# Patient Record
Sex: Female | Born: 1990 | Race: White | Hispanic: No | Marital: Married | Smoking: Never smoker
Health system: Southern US, Community
[De-identification: ages and names within clinical notes are randomized; demographics above are authoritative.]

## PROBLEM LIST (undated history)

## (undated) DIAGNOSIS — D649 Anemia, unspecified: Secondary | ICD-10-CM

## (undated) DIAGNOSIS — T8859XA Other complications of anesthesia, initial encounter: Secondary | ICD-10-CM

## (undated) HISTORY — DX: Anemia, unspecified: D64.9

## (undated) HISTORY — DX: Other complications of anesthesia, initial encounter: T88.59XA

---

## 2021-08-08 ENCOUNTER — Ambulatory Visit (INDEPENDENT_AMBULATORY_CARE_PROVIDER_SITE_OTHER): Payer: 59

## 2021-08-08 ENCOUNTER — Other Ambulatory Visit: Payer: Self-pay

## 2021-08-08 VITALS — BP 121/68 | HR 77 | Ht 70.87 in | Wt 137.0 lb

## 2021-08-08 DIAGNOSIS — Z3201 Encounter for pregnancy test, result positive: Secondary | ICD-10-CM | POA: Diagnosis not present

## 2021-08-08 LAB — POCT PREGNANCY, URINE: Preg Test, Ur: POSITIVE — AB

## 2021-08-08 MED ORDER — PRENATAL 27-1 MG PO TABS: 1.0000 | ORAL_TABLET | Freq: Every day | ORAL | 8 refills | Status: DC

## 2021-08-08 NOTE — Progress Notes (Signed)
Pt dropped off urine today for UPT. UPT is positive. Pt is new to [place] from [place]. [facility] interpreter used for visit.    Pt denies any vaginal bleeding, abd pain or cramps at this time. Pt advised to start taking PNV. Pt does not have PNV. PNV sent to pharmacy on file. Pt verbalized understanding and agreeable to plan of care. Pt states having some nausea throughout the days, but denies any vomiting. Pt given safe medication list.   LMP: 06/10/2021 EDC: 03/17/2022 [redacted]w[redacted]d   Judeth Cornfield, RN

## 2021-08-08 NOTE — Patient Instructions (Signed)

## 2021-08-15 ENCOUNTER — Other Ambulatory Visit (HOSPITAL_COMMUNITY)
Admission: RE | Admit: 2021-08-15 | Discharge: 2021-08-15 | Disposition: A | Discharge status: Home / Self Care | Payer: Managed Care, Other (non HMO) | Source: Ambulatory Visit | Attending: Family Medicine | Admitting: Family Medicine

## 2021-08-15 ENCOUNTER — Other Ambulatory Visit: Payer: Self-pay

## 2021-08-15 ENCOUNTER — Ambulatory Visit (INDEPENDENT_AMBULATORY_CARE_PROVIDER_SITE_OTHER): Payer: 59 | Admitting: *Deleted

## 2021-08-15 VITALS — BP 102/68 | HR 83 | Wt 136.0 lb

## 2021-08-15 DIAGNOSIS — O09899 Supervision of other high risk pregnancies, unspecified trimester: Secondary | ICD-10-CM | POA: Diagnosis present

## 2021-08-15 DIAGNOSIS — O0990 Supervision of high risk pregnancy, unspecified, unspecified trimester: Secondary | ICD-10-CM | POA: Insufficient documentation

## 2021-08-15 DIAGNOSIS — Z8751 Personal history of pre-term labor: Secondary | ICD-10-CM

## 2021-08-15 DIAGNOSIS — Z789 Other specified health status: Secondary | ICD-10-CM | POA: Insufficient documentation

## 2021-08-15 HISTORY — DX: Personal history of pre-term labor: Z87.51

## 2021-08-15 NOTE — Patient Instructions (Signed)
?  At our [facility], we work as an integrated team, providing care to address both physical and emotional health. Your medical provider may refer you to see our Behavioral Health Clinician (BHC) on the same day you see your medical provider, as availability permits; often scheduled virtually at your convenience.  ?Our BHC is available to all patients, visits generally last between 20-30 minutes, but can be longer or shorter, depending on patient need. The BHC offers help with stress management, coping with symptoms of depression and anxiety, major life changes , sleep issues, changing risky behavior, grief and loss, life stress, working on personal life goals, and  behavioral health issues, as these all affect your overall health and wellness.  ?The BHC is NOT available for the following: FMLA paperwork, court-ordered evaluations, specialty assessments (custody or disability), letters to employers, or obtaining certification for an emotional support animal. The BHC does not provide long-term therapy. ?You have the right to refuse integrated behavioral health services, or to reschedule to see the BHC at a later date.  ?Confidentiality exception: If it is suspected that a child or disabled adult is being abused or neglected, we are required by law to report that to either Child Protective Services or Adult Protective Services.  ?If you have a diagnosis of Bipolar affective disorder, Schizophrenia, or recurrent Major depressive disorder, we will recommend that you establish care with a psychiatrist, as these are lifelong, chronic conditions, and we want your overall emotional health and medications to be more closely monitored. If you anticipate needing extended maternity leave due to mental health issues postpartum, it it recommended you inform your medical provider, so we can put in a referral to a psychiatrist as soon as possible. The BHC is unable to recommend an extended maternity leave for mental  health issues. ?Your medical provider or BHC may refer you to a therapist for ongoing, traditional therapy, or to a psychiatrist, for medication management, if it would benefit your overall health. ?Depending on your insurance, you may have a copay or be charged a deductible, depending on your insurance, to see the BHC. If you are uninsured, it is recommended that you apply for financial assistance. (Forms may be requested at the front desk for in-person visits, via MyChart, or request a form during a virtual visit).  ?If you see the BHC more than 6 times, you will have to complete a comprehensive clinical assessment interview with the BHC to resume integrated services.  ?For virtual visits with the BHC, you must be physically in the state of [place] at the time of the visit. For example, if you live in [place], you will have to do an in-person visit with the BHC, and your out-of-state insurance may not cover behavioral health services in [place]. If you are going out of the state or country for any reason, the BHC may see you virtually when you return to [place], but not while you are physically outside of [place].  ?  ?Here is a link to the Pregnancy Navigators . Please ?Fill out prior to your New OB appointment.  ? ?English Link: https://[email or web address].tfaforms.[email or web address]?[email or web address] ? ?Spanish Link: https://[place].tfaforms.[email or web address]?[email or web address]  ?[email or web address] is a website with information to help you prepare for Labor and delivery, patient information, visitor information and more.   ?

## 2021-08-15 NOTE — Progress Notes (Signed)
New OB Intake ? ?Patient came to office for intake.  ?I explained I am completing New OB Intake today. We discussed her EDD of 03/17/22 that is based on LMP of 06/10/22. Pt is G2/P0101. I reviewed her allergies, medications, Medical/Surgical/OB history, and appropriate screenings. I informed her of [facility] services. Based on history, this is a/an  pregnancy complicated by PTD  .  ? ?Patient Active Problem List  ? Diagnosis Date Noted  ? Supervision of high risk pregnancy, antepartum 08/15/2021  ? History of preterm delivery, currently pregnant 08/15/2021  ? Language barrier 08/15/2021  ? ? ?Concerns addressed today ? ?Delivery Plans:  ?Plans to deliver at [facility].  ? ?Waterbirth candidate? Not offered ? ?MyChart/Babyscripts ?Does not have MyChart - sent text to her to sign up. She received text. . I explained pt will have some visits in office and some virtually. Not candidate for Babyscripts . ? ?Blood Pressure Cuff  ?Patient has private insurance; instructed to purchase blood pressure cuff and bring to first prenatal appt. Explained after first prenatal appt pt will check weekly and document on log.  ? ?Weight scale: Patient ?does not  have weight scale- has [employer].  ? ?Anatomy US ?Explained first scheduled [place] will be around 19 weeks. Anatomy US scheduled for 10/21/21 at 0900. Pt notified to arrive at 0845. ? ?Labs ?Discussed Avelina Laine genetic screening with patient. Would like both Panorama and Horizon drawn at new OB visit.Also if interested in genetic testing, tell patient she will need AFP 15-21 weeks to complete genetic testing .Routine prenatal labs drawn today. Genetic not drawn due to too early.  ? ?Covid Vaccine ?Patient has not had covid vaccine.  ? ?Is patient a CenteringPregnancy candidate? Not a candidate  ? ?Is patient a Mom+Baby Combined Care candidate? Not a candidate    ? ?Informed patient of [facility] website  and placed link in her AVS.  ? ?Social Determinants of Health ?Food  Insecurity: Patient denies food insecurity. ?WIC Referral: Patient is not interested in referral to [facility].  ?Transportation: Patient denies transportation needs. ?Childcare: Discussed no children allowed at ultrasound appointments. Offered childcare services; patient declines childcare services at this time. ? ?Send link to Pregnancy Navigators ? ? ?Placed OB Box on problem list and updated ? ?First visit review ?I reviewed new OB appt with pt. I explained she will have a pelvic exam, genetic screening if desired, and PAP smear. New ob appointment was scheduled with app, changed to MD Explained pt will be seen by Dr. Jolayne Panther at first visit; encounter routed to appropriate provider. Explained that patient will be seen by pregnancy navigator following visit with provider. [facility] information placed in AVS.  ? Nancy Fetter ?08/15/2021  12:57 PM  ?

## 2021-08-16 ENCOUNTER — Telehealth: Payer: Self-pay

## 2021-08-16 LAB — HEMOGLOBIN A1C
Est. average glucose Bld gHb Est-mCnc: 100 mg/dL
Hgb A1c MFr Bld: 5.1 % (ref 4.8–5.6)

## 2021-08-16 LAB — CBC/D/PLT+RPR+RH+ABO+RUBIGG...
Antibody Screen: NEGATIVE
Basophils Absolute: 0 10*3/uL (ref 0.0–0.2)
Basos: 0 %
EOS (ABSOLUTE): 0.1 10*3/uL (ref 0.0–0.4)
Eos: 1 %
HCV Ab: NONREACTIVE
HIV Screen 4th Generation wRfx: NONREACTIVE
Hematocrit: 36.2 % (ref 34.0–46.6)
Hemoglobin: 12.5 g/dL (ref 11.1–15.9)
Hepatitis B Surface Ag: NEGATIVE
Immature Grans (Abs): 0 10*3/uL (ref 0.0–0.1)
Immature Granulocytes: 0 %
Lymphocytes Absolute: 1.9 10*3/uL (ref 0.7–3.1)
Lymphs: 22 %
MCH: 31.3 pg (ref 26.6–33.0)
MCHC: 34.5 g/dL (ref 31.5–35.7)
MCV: 91 fL (ref 79–97)
Monocytes Absolute: 0.4 10*3/uL (ref 0.1–0.9)
Monocytes: 4 %
Neutrophils Absolute: 6.4 10*3/uL (ref 1.4–7.0)
Neutrophils: 73 %
Platelets: 317 10*3/uL (ref 150–450)
RBC: 4 x10E6/uL (ref 3.77–5.28)
RDW: 11.6 % — ABNORMAL LOW (ref 11.7–15.4)
RPR Ser Ql: NONREACTIVE
Rh Factor: POSITIVE
Rubella Antibodies, IGG: 7.91 index (ref >0.99)
WBC: 8.8 10*3/uL (ref 3.4–10.8)

## 2021-08-16 LAB — GC/CHLAMYDIA PROBE AMP (~~LOC~~) NOT AT ARMC
Chlamydia: NEGATIVE
Comment: NEGATIVE
Comment: NORMAL
Neisseria Gonorrhea: NEGATIVE

## 2021-08-16 LAB — HCV INTERPRETATION

## 2021-08-16 NOTE — Telephone Encounter (Signed)
Call transferred from front office stating patient was using husband as interpreter with complaint of brown discharge. I spoke with patient and she states she is experiencing some brown vaginal discharge that began yesterday. Patient denies any pain, vaginal irritation or odor to discharge. Patient denies having sexual intercourse preceding this discharge. I explained to patient that if this discharge does not resolve by the middle of next week to notify us and then we can set up a Nurse Visit appointment for a self swab. Patient verbalized understanding and denies any other questions.  ? ?Alesia Richards, RN ?08/16/21  ?

## 2021-08-16 NOTE — Progress Notes (Signed)
Patient was assessed and managed by nursing staff during this encounter. I have reviewed the chart and agree with the documentation and plan. I have also made any necessary editorial changes. ? ?Mora Bellman, MD ?08/16/2021 1:50 PM  ? ?

## 2021-08-17 LAB — URINE CULTURE, OB REFLEX

## 2021-08-17 LAB — CULTURE, OB URINE

## 2021-08-28 ENCOUNTER — Encounter: Payer: 59 | Admitting: Family Medicine

## 2021-09-05 ENCOUNTER — Ambulatory Visit (INDEPENDENT_AMBULATORY_CARE_PROVIDER_SITE_OTHER): Payer: 59 | Admitting: Obstetrics and Gynecology

## 2021-09-05 ENCOUNTER — Encounter: Payer: Self-pay | Admitting: Obstetrics and Gynecology

## 2021-09-05 ENCOUNTER — Other Ambulatory Visit (HOSPITAL_COMMUNITY)
Admission: RE | Admit: 2021-09-05 | Discharge: 2021-09-05 | Disposition: A | Discharge status: Home / Self Care | Payer: Managed Care, Other (non HMO) | Source: Ambulatory Visit | Attending: Obstetrics and Gynecology | Admitting: Obstetrics and Gynecology

## 2021-09-05 ENCOUNTER — Other Ambulatory Visit: Payer: Self-pay

## 2021-09-05 VITALS — BP 128/81 | HR 90 | Wt 136.9 lb

## 2021-09-05 DIAGNOSIS — O09899 Supervision of other high risk pregnancies, unspecified trimester: Secondary | ICD-10-CM

## 2021-09-05 DIAGNOSIS — O0990 Supervision of high risk pregnancy, unspecified, unspecified trimester: Secondary | ICD-10-CM

## 2021-09-05 DIAGNOSIS — Z1151 Encounter for screening for human papillomavirus (HPV): Secondary | ICD-10-CM

## 2021-09-05 DIAGNOSIS — O34219 Maternal care for unspecified type scar from previous cesarean delivery: Secondary | ICD-10-CM | POA: Insufficient documentation

## 2021-09-05 DIAGNOSIS — Z789 Other specified health status: Secondary | ICD-10-CM

## 2021-09-05 NOTE — Patient Instructions (Signed)
First Trimester of Pregnancy ?The first trimester of pregnancy starts on the first day of your last menstrual period until the end of week 12. This is months 1 through 3 of pregnancy. A week after a sperm fertilizes an egg, the egg will implant into the wall of the uterus and begin to develop into a baby. By the end of 12 weeks, all the baby's organs will be formed and the baby will be 2-3 inches in size. ?Body changes during your first trimester ?Your body goes through many changes during pregnancy. The changes vary and generally return to normal after your baby is born. ?Physical changes ?You may gain or lose weight. ?Your breasts may begin to grow larger and become tender. The tissue that surrounds your nipples (areola) may become darker. ?Dark spots or blotches (chloasma or mask of pregnancy) may develop on your face. ?You may have changes in your hair. These can include thickening or thinning of your hair or changes in texture. ?Health changes ?You may feel nauseous, and you may vomit. ?You may have heartburn. ?You may develop headaches. ?You may develop constipation. ?Your gums may bleed and may be sensitive to brushing and flossing. ?Other changes ?You may tire easily. ?You may urinate more often. ?Your menstrual periods will stop. ?You may have a loss of appetite. ?You may develop cravings for certain kinds of food. ?You may have changes in your emotions from day to day. ?You may have more vivid and strange dreams. ?Follow these instructions at home: ?Medicines ?Follow your health care provider's instructions regarding medicine use. Specific medicines may be either safe or unsafe to take during pregnancy. Do not take any medicines unless told to by your health care provider. ?Take a prenatal vitamin that contains at least 600 micrograms (mcg) of folic acid. ?Eating and drinking ?Eat a healthy diet that includes fresh fruits and vegetables, whole grains, good sources of protein such as meat, eggs, or tofu,  and low-fat dairy products. ?Avoid raw meat and unpasteurized juice, milk, and cheese. These carry germs that can harm you and your baby. ?If you feel nauseous or you vomit: ?Eat 4 or 5 small meals a day instead of 3 large meals. ?Try eating a few soda crackers. ?Drink liquids between meals instead of during meals. ?You may need to take these actions to prevent or treat constipation: ?Drink enough fluid to keep your urine pale yellow. ?Eat foods that are high in fiber, such as beans, whole grains, and fresh fruits and vegetables. ?Limit foods that are high in fat and processed sugars, such as fried or sweet foods. ?Activity ?Exercise only as directed by your health care provider. Most people can continue their usual exercise routine during pregnancy. Try to exercise for 30 minutes at least 5 days a week. ?Stop exercising if you develop pain or cramping in the lower abdomen or lower back. ?Avoid exercising if it is very hot or humid or if you are at high altitude. ?Avoid heavy lifting. ?If you choose to, you may have sex unless your health care provider tells you not to. ?Relieving pain and discomfort ?Wear a good support bra to relieve breast tenderness. ?Rest with your legs elevated if you have leg cramps or low back pain. ?If you develop bulging veins (varicose veins) in your legs: ?Wear support hose as told by your health care provider. ?Elevate your feet for 15 minutes, 3-4 times a day. ?Limit salt in your diet. ?Safety ?Wear your seat belt at all times when driving  or riding in a car. ?Talk with your health care provider if someone is verbally or physically abusive to you. ?Talk with your health care provider if you are feeling sad or have thoughts of hurting yourself. ?Lifestyle ?Do not use hot tubs, steam rooms, or saunas. ?Do not douche. Do not use tampons or scented sanitary pads. ?Do not use herbal remedies, alcohol, illegal drugs, or medicines that are not approved by your health care provider. Chemicals  in these products can harm your baby. ?Do not use any products that contain nicotine or tobacco, such as cigarettes, e-cigarettes, and chewing tobacco. If you need help quitting, ask your health care provider. ?Avoid cat litter boxes and soil used by cats. These carry germs that can cause birth defects in the baby and possibly loss of the unborn baby (fetus) by miscarriage or stillbirth. ?General instructions ?During routine prenatal visits in the first trimester, your health care provider will do a physical exam, perform necessary tests, and ask you how things are going. Keep all follow-up visits. This is important. ?Ask for help if you have counseling or nutritional needs during pregnancy. Your health care provider can offer advice or refer you to specialists for help with various needs. ?Schedule a dentist appointment. At home, brush your teeth with a soft toothbrush. Floss gently. ?Write down your questions. Take them to your prenatal visits. ?Where to find more information ?American Pregnancy Association: americanpregnancy.org ?[employer] of [employer]st[employer]ians [employer] Gynecologists: https://www.todd-brady.net/ ?Office on Women's Health: MightyReward.co.nz ?Contact a health care provider if you have: ?Dizziness. ?A fever. ?Mild pelvic cramps, pelvic pressure, or nagging pain in the abdominal area. ?Nausea, vomiting, or diarrhea that lasts for 24 hours or longer. ?A bad-smelling vaginal discharge. ?Pain when you urinate. ?Known exposure to a contagious illness, such as chickenpox, measles, Zika virus, HIV, or hepatitis. ?Get help right away if you have: ?Spotting or bleeding from your vagina. ?Severe abdominal cramping or pain. ?Shortness of breath or chest pain. ?Any kind of trauma, such as from a fall or a car crash. ?New or increased pain, swelling, or redness in an arm or leg. ?Summary ?The first trimester of pregnancy starts on the first day of your last menstrual period until the end of week  12 (months 1 through 3). ?Eating 4 or 5 small meals a day rather than 3 large meals may help to relieve nausea and vomiting. ?Do not use any products that contain nicotine or tobacco, such as cigarettes, e-cigarettes, and chewing tobacco. If you need help quitting, ask your health care provider. ?Keep all follow-up visits. This is important. ?This information is not intended to replace advice given to you by your health care provider. Make sure you discuss any questions you have with your health care provider. ?Document Revised: 11/09/2019 Document Reviewed: 09/15/2019 ?Elsevier Patient Education ? 2022 Elsevier Inc. ? ?Vaginal Birth After Cesarean Delivery ?Vaginal birth after cesarean delivery (VBAC) means giving birth vaginally after previously delivering a baby through a cesarean section, or C-section. VBAC may be a safe option for you, depending on your health and other factors. ?Discuss VBAC with your health care provider early in your pregnancy, so you can understand the risks, benefits, and options. Having these discussions early will give you time to make your birth plan. ?What increases the chances for a successful VBAC? ?These factors increase your chances of a successful VBAC: ?You have had only one previous C-section. ?You had a low transverse incision for your C-section. ?You have had a successful vaginal birth. ?  Your labor starts naturally on or before your due date. ?You and your baby have had a healthy pregnancy. ?Your baby is head-down. ?What happens when I arrive at the birth center or hospital? ?Once you are in labor and have been admitted into the hospital or birth center, your health care team may: ?Review your pregnancy history and go over any concerns you have. ?Talk with you about your birth plan and discuss pain control options. ?Check your blood pressure, breathing rate, and heart rate. ?Check your baby's heartbeat. ?Monitor your uterus for contractions. ?Check whether your bag of water  (amniotic sac) has broken (ruptured). ?Insert an IV into one of your veins. You may get fluids and medicine through the IV. ?Monitoring and exams ?Your health care team may check your contractions (uterin

## 2021-09-05 NOTE — Progress Notes (Signed)
?  Subjective:  ? ? Heather Leonard is a G2P0101 [redacted]w[redacted]d being seen today for her first obstetrical visit.  Her obstetrical history is significant for PPROM resulting in an emergency cesarean section. Patient is a poor historian regarding circumstances of her cesarean section. At time she describes failure to progress after 30 hours, PPROM in the setting of IUGR and emergency dur to vaginal bleeding. Patient is uncertain of the type of incision and reports multiple complications following we cesarean section but is not able to describe them. Patient will attempt to obtain records. Patient does intend to breast feed. Pregnancy history fully reviewed. ? ?Patient reports no complaints. ? ?Vitals:  ? 09/05/21 1516  ?BP: 128/81  ?Pulse: 90  ?Weight: 136 lb 14.4 oz (62.1 kg)  ? ? ?HISTORY: ?OB History  ?Gravida Para Term Preterm AB Living  ?2 1   1   1   ?SAB IAB Ectopic Multiple Live Births  ?        1  ?  ?# Outcome Date GA Lbr Len/2nd Weight Sex Delivery Anes PTL Lv  ?2 Current           ?1 Preterm 10/23/18 [redacted]w[redacted]d  3 lb 8.4 oz (1.6 kg) M CS-Unspec EPI Y LIV  ?   Birth Comments: said that day woke up PPROM, bleeding and had emergency c/s  ? ?Past Medical History:  ?Diagnosis Date  ? Anemia   ? Complication of anesthesia   ? problems walking and moving 1 week later  ? ?Past Surgical History:  ?Procedure Laterality Date  ? CESAREAN SECTION    ? ?Family History  ?Problem Relation Age of Onset  ? Varicose Veins Mother   ? ? ? ?Exam  ? ? ?Uterus:   12-weeks  ?Pelvic Exam:   ? Perineum: Normal Perineum  ? Vulva: normal  ? Vagina:  normal mucosa, normal discharge  ? pH:   ? Cervix: nulliparous appearance and cervix is closed and long  ? Adnexa: no mass, fullness, tenderness  ? Bony Pelvis: gynecoid  ?System: Breast:  normal appearance, no masses or tenderness  ? Skin: normal coloration and turgor, no rashes ?  ? Neurologic: oriented, no focal deficits  ? Extremities: normal strength, tone, and muscle mass  ? HEENT extra ocular  movement intact  ? Mouth/Teeth mucous membranes moist, pharynx normal without lesions and dental hygiene good  ? Neck supple and no masses  ? Cardiovascular: regular rate and rhythm  ? Respiratory:  appears well, vitals normal, no respiratory distress, acyanotic, normal RR, chest clear, no wheezing, crepitations, rhonchi, normal symmetric air entry  ? Abdomen: soft, non-tender; bowel sounds normal; no masses,  no organomegaly  ? Urinary:   ? ? ?  ?Assessment:  ? ? Pregnancy: G2P0101 ?Patient Active Problem List  ? Diagnosis Date Noted  ? Supervision of high risk pregnancy, antepartum 08/15/2021  ? History of preterm delivery, currently pregnant 08/15/2021  ? Language barrier 08/15/2021  ? ?  ? ?  ?Plan:  ? ?  ?Initial labs drawn. ?Prenatal vitamins. ?Problem list reviewed and updated. ?Genetic Screening discussed : panorama collected. ? Ultrasound discussed; fetal survey: scheduled on 10/21/21 ?Briefly discussed TOLAC vs RCS- Patient will attempt to obtain delivery records as she is interested in TOLAC ? Follow up in 4 weeks. ?50% of 30 min visit spent on counseling and coordination of care.  ?  ? ?Zygmund Passero ?09/05/2021 ? ? ?

## 2021-09-11 ENCOUNTER — Telehealth: Payer: Self-pay | Admitting: Family Medicine

## 2021-09-11 NOTE — Telephone Encounter (Signed)
Patient has been having bad headaches and requesting someone to call her.  ?

## 2021-09-12 LAB — CYTOLOGY - PAP
Chlamydia: NEGATIVE
Comment: NEGATIVE
Comment: NEGATIVE
Comment: NEGATIVE
Comment: NORMAL
Diagnosis: NEGATIVE
High risk HPV: NEGATIVE
Neisseria Gonorrhea: NEGATIVE
Trichomonas: NEGATIVE

## 2021-09-17 NOTE — Telephone Encounter (Signed)
Called pt with [facility] interpreter ID [phone]. Spoke with patient's husband. Pt does not have phone number. Husband states that patient is feeling much better. Still having some cough. Does have list of medications safe to take in pregnancy. Husband gives patient phone number, states she will need interpreter. Husband will notify patient that I called. Called pt's number with [employer] interpreter ID [phone]; message states that this number cannot be reached at this time. ?

## 2021-10-04 ENCOUNTER — Ambulatory Visit (INDEPENDENT_AMBULATORY_CARE_PROVIDER_SITE_OTHER): Payer: 59 | Admitting: Certified Nurse Midwife

## 2021-10-04 VITALS — BP 109/72 | HR 89 | Wt 137.9 lb

## 2021-10-04 DIAGNOSIS — Z98891 History of uterine scar from previous surgery: Secondary | ICD-10-CM

## 2021-10-04 DIAGNOSIS — O219 Vomiting of pregnancy, unspecified: Secondary | ICD-10-CM

## 2021-10-04 DIAGNOSIS — R55 Syncope and collapse: Secondary | ICD-10-CM

## 2021-10-04 DIAGNOSIS — O26892 Other specified pregnancy related conditions, second trimester: Secondary | ICD-10-CM

## 2021-10-04 DIAGNOSIS — R519 Headache, unspecified: Secondary | ICD-10-CM

## 2021-10-04 DIAGNOSIS — Z3492 Encounter for supervision of normal pregnancy, unspecified, second trimester: Secondary | ICD-10-CM

## 2021-10-04 DIAGNOSIS — Z3A16 16 weeks gestation of pregnancy: Secondary | ICD-10-CM

## 2021-10-04 NOTE — Progress Notes (Signed)
? ?  PRENATAL VISIT NOTE ? ?Subjective:  ?Heather Leonard is a 31 y.o. G2P0101 at [redacted]w[redacted]d being seen today for ongoing prenatal care.  She is currently monitored for the following issues for this high-risk pregnancy and has Supervision of high risk pregnancy, antepartum; History of preterm delivery, currently pregnant; Language barrier; and Previous cesarean delivery, antepartum on their problem list. ? ?Patient reports headache, nausea, and had an episode last week of near syncope while walking around the playground which resolved with deep breathing, rest and hydration. This has happened before during growth spurts as an adolescent  .  Contractions: Not present. Vag. Bleeding: None.  Movement: Absent. Denies leaking of fluid.  ? ?The following portions of the patient's history were reviewed and updated as appropriate: allergies, current medications, past family history, past medical history, past social history, past surgical history and problem list.  ? ?Objective:  ? ?Vitals:  ? 10/04/21 1137  ?BP: 109/72  ?Pulse: 89  ?Weight: 137 lb 14.4 oz (62.6 kg)  ? ? ?Fetal Status: Fetal Heart Rate (bpm): 158   Movement: Absent    ? ?General:  Alert, oriented and cooperative. Patient is in no acute distress.  ?Skin: Skin is warm and dry. No rash noted.   ?Cardiovascular: Normal heart rate noted  ?Respiratory: Normal respiratory effort, no problems with respiration noted  ?Abdomen: Soft, gravid, appropriate for gestational age.  Pain/Pressure: Present     ?Pelvic: Cervical exam deferred        ?Extremities: Normal range of motion.  Edema: None  ?Mental Status: Normal mood and affect. Normal behavior. Normal judgment and thought content.  ? ?Assessment and Plan:  ?Pregnancy: G2P0101 at [redacted]w[redacted]d ?1. Supervision of low-risk pregnancy, second trimester ?- Doing well overall ? ?2. [redacted] weeks gestation of pregnancy ?- Routine OB care  ? ?3. History of cesarean section ?- Desires TOLAC, will need MD consent ? ?4. Nausea and vomiting  during pregnancy ?- Getting much better ? ?5. Pregnancy headache in second trimester ?- Resolves with hydration, informed she can take Tylenol for headaches ? ?6. Near syncope ?- reviewed importance of regular food and hydration to prevent these episodes. Will refer to OB cardio if needed ? ?Preterm labor symptoms and general obstetric precautions including but not limited to vaginal bleeding, contractions, leaking of fluid and fetal movement were reviewed in detail with the patient. ?Please refer to After Visit Summary for other counseling recommendations.  ? ?Return in about 4 weeks (around 11/01/2021) for IN-PERSON, LOB. ? ?Future Appointments  ?Date Time Provider [place]  ?10/21/2021  8:45 AM [facility] NURSE [facility]  ?10/21/2021  9:00 AM [facility] US1 [facility]  ?11/04/2021  3:15 PM Tresea Mall, CNM [facility]  ? ? ?Gabriel Carina, CNM ?

## 2021-10-11 ENCOUNTER — Ambulatory Visit
Admission: EM | Admit: 2021-10-11 | Discharge: 2021-10-11 | Disposition: A | Discharge status: Home / Self Care | Payer: Managed Care, Other (non HMO) | Attending: Urgent Care | Admitting: Urgent Care

## 2021-10-11 DIAGNOSIS — Z3A16 16 weeks gestation of pregnancy: Secondary | ICD-10-CM

## 2021-10-11 DIAGNOSIS — H6982 Other specified disorders of Eustachian tube, left ear: Secondary | ICD-10-CM

## 2021-10-11 MED ORDER — FLUTICASONE PROPIONATE 50 MCG/ACT NA SUSP: 2.0000 | Freq: Every day | NASAL | 12 refills | Status: DC

## 2021-10-11 MED ORDER — CETIRIZINE HCL 10 MG PO TABS
10.0000 mg | ORAL_TABLET | Freq: Every day | ORAL | 0 refills | Status: DC
Start: 2021-10-11 — End: 2021-11-04

## 2021-10-11 MED ORDER — PSEUDOEPHEDRINE HCL 30 MG PO TABS: 30.0000 mg | ORAL_TABLET | Freq: Three times a day (TID) | ORAL | 0 refills | Status: DC | PRN

## 2021-10-11 NOTE — ED Triage Notes (Signed)
Pt c/o cough, runny nose, pain in left ear, frontal sinus pressure,  ? ?Onset ~ > 1 week ago  ? ?Currently pregnant.as seen by a [facility] provider ~ 1 week ago and recommended tea and tylenol without relief by patient. Now ear pain is worse.  ?

## 2021-10-11 NOTE — ED Provider Notes (Signed)
?[facility] ? ? ?MRN: [id number] DOB: 05-31-1991 ? ?Subjective:  ? ?Heather Leonard is a 31 y.o. female presenting for 1 week history of persistent sinus pressure, runny nose now having persistent left ear pain, left-sided facial discomfort and fullness.  Has been using Tylenol without much relief.  She is [redacted] weeks pregnant and therefore has not taken much medications out of concern for pregnancy.  No fever, ear drainage, tinnitus cough, chest pain, shortness of breath or wheezing. ? ?No current facility-administered medications for this encounter. ? ?Current Outpatient Medications:  ?  cetirizine (ZYRTEC ALLERGY) 10 MG tablet, Take 1 tablet (10 mg total) by mouth daily., Disp: 30 tablet, Rfl: 0 ?  fluticasone (FLONASE) 50 MCG/ACT nasal spray, Place 2 sprays into both nostrils daily., Disp: 16 g, Rfl: 12 ?  pseudoephedrine (SUDAFED) 30 MG tablet, Take 1 tablet (30 mg total) by mouth every 8 (eight) hours as needed for congestion., Disp: 30 tablet, Rfl: 0 ?  Prenatal 27-1 MG TABS, Take 1 tablet by mouth daily., Disp: 30 tablet, Rfl: 8  ? ?Allergies  ?Allergen Reactions  ? Cynara Scolymus (Artichoke) Rash  ?  Per pt was artichoke extract  ? ? ?Past Medical History:  ?Diagnosis Date  ? Anemia   ? Complication of anesthesia   ? problems walking and moving 1 week later  ?  ? ?Past Surgical History:  ?Procedure Laterality Date  ? CESAREAN SECTION    ? ? ?Family History  ?Problem Relation Age of Onset  ? Varicose Veins Mother   ? ? ?Social History  ? ?Tobacco Use  ? Smoking status: Never  ? Smokeless tobacco: Never  ?Vaping Use  ? Vaping Use: Never used  ?Substance Use Topics  ? Alcohol use: Not Currently  ?  Comment: rarely , stopped with pregnancy  ? Drug use: Never  ? ? ?ROS ? ? ?Objective:  ? ?Vitals: ?BP 119/74 (BP Location: Left Arm)   Pulse 91   Temp 98.3 ?F (36.8 ?C) (Oral)   Resp 18   LMP 06/10/2021 (Exact Date)   SpO2 99%  ? ?Physical Exam ?Constitutional:   ?   General: She is not in acute  distress. ?   Appearance: Normal appearance. She is well-developed and normal weight. She is not ill-appearing, toxic-appearing or diaphoretic.  ?HENT:  ?   Head: Normocephalic and atraumatic.  ?   Right Ear: Ear canal and external ear normal. No drainage or tenderness. No middle ear effusion. There is no impacted cerumen. Tympanic membrane is not erythematous.  ?   Left Ear: Ear canal and external ear normal. No drainage or tenderness.  No middle ear effusion. There is no impacted cerumen. Tympanic membrane is not erythematous.  ?   Ears:  ?   Comments: TMs opacified bilaterally but without erythema and both are intact. ?   Nose: Congestion present. No rhinorrhea.  ?   Mouth/Throat:  ?   Mouth: Mucous membranes are moist. No oral lesions.  ?   Pharynx: No pharyngeal swelling, oropharyngeal exudate, posterior oropharyngeal erythema or uvula swelling.  ?   Tonsils: No tonsillar exudate or tonsillar abscesses.  ?Eyes:  ?   General: No scleral icterus.    ?   Right eye: No discharge.     ?   Left eye: No discharge.  ?   Extraocular Movements: Extraocular movements intact.  ?   Right eye: Normal extraocular motion.  ?   Left eye: Normal extraocular motion.  ?  Conjunctiva/sclera: Conjunctivae normal.  ?Cardiovascular:  ?   Rate and Rhythm: Normal rate.  ?Pulmonary:  ?   Effort: Pulmonary effort is normal.  ?Musculoskeletal:  ?   Cervical back: Normal range of motion and neck supple.  ?Lymphadenopathy:  ?   Cervical: No cervical adenopathy.  ?Skin: ?   General: Skin is warm and dry.  ?Neurological:  ?   General: No focal deficit present.  ?   Mental Status: She is alert and oriented to person, place, and time.  ?Psychiatric:     ?   Mood and Affect: Mood normal.     ?   Behavior: Behavior normal.  ? ? ?Assessment and Plan :  ? ?PDMP not reviewed this encounter. ? ?1. Eustachian tube dysfunction, left   ?2. [redacted] weeks gestation of pregnancy   ? ?Unremarkable ENT exam.  Will use conservative management for what I suspect is  eustachian tube dysfunction.  Recommended starting Flonase, Zyrtec, Sudafed.  Should patient develop a fever and continue to have ear pain, recommended recheck for a course of antibiotics to address otitis media.  No signs of this today and therefore will hold off on antibiotic use.  Counseled patient on potential for adverse effects with medications prescribed/recommended today, ER and return-to-clinic precautions discussed, patient verbalized understanding. ? ?  ?Wallis Bamberg, PA-C ?10/11/21 1828 ? ?

## 2021-10-21 ENCOUNTER — Ambulatory Visit: Payer: Managed Care, Other (non HMO) | Admitting: *Deleted

## 2021-10-21 ENCOUNTER — Encounter: Payer: Self-pay | Admitting: *Deleted

## 2021-10-21 ENCOUNTER — Other Ambulatory Visit: Payer: Self-pay | Admitting: *Deleted

## 2021-10-21 ENCOUNTER — Ambulatory Visit: Payer: Managed Care, Other (non HMO) | Attending: Obstetrics and Gynecology

## 2021-10-21 ENCOUNTER — Other Ambulatory Visit: Payer: Self-pay | Admitting: Obstetrics and Gynecology

## 2021-10-21 VITALS — BP 99/54 | HR 98

## 2021-10-21 DIAGNOSIS — O34219 Maternal care for unspecified type scar from previous cesarean delivery: Secondary | ICD-10-CM | POA: Diagnosis present

## 2021-10-21 DIAGNOSIS — Z789 Other specified health status: Secondary | ICD-10-CM

## 2021-10-21 DIAGNOSIS — O0990 Supervision of high risk pregnancy, unspecified, unspecified trimester: Secondary | ICD-10-CM

## 2021-10-21 DIAGNOSIS — O09899 Supervision of other high risk pregnancies, unspecified trimester: Secondary | ICD-10-CM | POA: Diagnosis present

## 2021-11-04 ENCOUNTER — Encounter (HOSPITAL_COMMUNITY): Payer: Self-pay | Admitting: Family Medicine

## 2021-11-04 ENCOUNTER — Encounter: Payer: Self-pay | Admitting: Obstetrics & Gynecology

## 2021-11-04 ENCOUNTER — Ambulatory Visit (INDEPENDENT_AMBULATORY_CARE_PROVIDER_SITE_OTHER): Payer: Managed Care, Other (non HMO) | Admitting: Obstetrics & Gynecology

## 2021-11-04 ENCOUNTER — Inpatient Hospital Stay (HOSPITAL_COMMUNITY)
Admission: AD | Admit: 2021-11-04 | Discharge: 2021-11-04 | Disposition: A | Discharge status: Home / Self Care | Payer: Managed Care, Other (non HMO) | Attending: Family Medicine | Admitting: Family Medicine

## 2021-11-04 ENCOUNTER — Inpatient Hospital Stay (HOSPITAL_BASED_OUTPATIENT_CLINIC_OR_DEPARTMENT_OTHER): Payer: Managed Care, Other (non HMO)

## 2021-11-04 VITALS — BP 109/71 | HR 79 | Wt 142.2 lb

## 2021-11-04 DIAGNOSIS — O34219 Maternal care for unspecified type scar from previous cesarean delivery: Secondary | ICD-10-CM | POA: Insufficient documentation

## 2021-11-04 DIAGNOSIS — Z3A21 21 weeks gestation of pregnancy: Secondary | ICD-10-CM | POA: Insufficient documentation

## 2021-11-04 DIAGNOSIS — N858 Other specified noninflammatory disorders of uterus: Secondary | ICD-10-CM | POA: Insufficient documentation

## 2021-11-04 DIAGNOSIS — O209 Hemorrhage in early pregnancy, unspecified: Secondary | ICD-10-CM | POA: Diagnosis not present

## 2021-11-04 DIAGNOSIS — O0990 Supervision of high risk pregnancy, unspecified, unspecified trimester: Secondary | ICD-10-CM

## 2021-11-04 DIAGNOSIS — O4692 Antepartum hemorrhage, unspecified, second trimester: Secondary | ICD-10-CM

## 2021-11-04 DIAGNOSIS — O09212 Supervision of pregnancy with history of pre-term labor, second trimester: Secondary | ICD-10-CM

## 2021-11-04 DIAGNOSIS — O09899 Supervision of other high risk pregnancies, unspecified trimester: Secondary | ICD-10-CM

## 2021-11-04 DIAGNOSIS — Z789 Other specified health status: Secondary | ICD-10-CM

## 2021-11-04 LAB — CBC
HCT: 32.5 % — ABNORMAL LOW (ref 36.0–46.0)
Hemoglobin: 11 g/dL — ABNORMAL LOW (ref 12.0–15.0)
MCH: 32 pg (ref 26.0–34.0)
MCHC: 33.8 g/dL (ref 30.0–36.0)
MCV: 94.5 fL (ref 80.0–100.0)
Platelets: 303 10*3/uL (ref 150–400)
RBC: 3.44 MIL/uL — ABNORMAL LOW (ref 3.87–5.11)
RDW: 12.7 % (ref 11.5–15.5)
WBC: 9.7 10*3/uL (ref 4.0–10.5)
nRBC: 0 % (ref 0.0–0.2)

## 2021-11-04 LAB — URINALYSIS, ROUTINE W REFLEX MICROSCOPIC
Bilirubin Urine: NEGATIVE
Glucose, UA: NEGATIVE mg/dL
Hgb urine dipstick: NEGATIVE
Ketones, ur: NEGATIVE mg/dL
Leukocytes,Ua: NEGATIVE
Nitrite: NEGATIVE
Protein, ur: NEGATIVE mg/dL
Specific Gravity, Urine: 1.014 (ref 1.005–1.030)
pH: 8 (ref 5.0–8.0)

## 2021-11-04 LAB — WET PREP, GENITAL
Clue Cells Wet Prep HPF POC: NONE SEEN
Sperm: NONE SEEN
Trich, Wet Prep: NONE SEEN
WBC, Wet Prep HPF POC: >=10 — AB (ref <10)
Yeast Wet Prep HPF POC: NONE SEEN

## 2021-11-04 NOTE — MAU Provider Note (Addendum)
History      CSN: [id number]  Arrival date and time: 11/04/21 5638   Event Date/Time   First Provider Initiated Contact with Patient 11/04/21 0930      No chief complaint on file.  Heather Leonard is a 31 year old woman G2P0101 who presents to the MAU for vaginal bleeding. This is the first episode of bleeding this pregnancy. She had a bowel movement this morning and reports she felt some abdominal "stiffness" and tenderness and found blood in the toilet and on the toilet paper. She reports seeing some small clots of blood, denies larger clots or profuse bleeding. She also endorses having 2 days of yellow discharge that is non-odorous and denies discomfort/itching. She also denies dysuria or increased urinary frequency. She endorses having had constipation this pregnancy but says she was not straining this morning.    She reports that in her previous pregnancy she had bleeding at 7 months, which precipitated a c-section for "low amniotic fluid" and "small fetus."   [place] Interpreter: Amma [phone]     Past Medical History:  Diagnosis Date   Anemia    Complication of anesthesia    problems walking and moving 1 week later    Past Surgical History:  Procedure Laterality Date   CESAREAN SECTION      Family History  Problem Relation Age of Onset   Varicose Veins Mother     Social History   Tobacco Use   Smoking status: Never   Smokeless tobacco: Never  Vaping Use   Vaping Use: Never used  Substance Use Topics   Alcohol use: Not Currently    Comment: rarely , stopped with pregnancy   Drug use: Never    Allergies:  Allergies  Allergen Reactions   Cynara Scolymus (Artichoke) Rash    Per pt was artichoke extract    Medications Prior to Admission  Medication Sig Dispense Refill Last Dose   Prenatal 27-1 MG TABS Take 1 tablet by mouth daily. 30 tablet 8 11/03/2021   cetirizine (ZYRTEC ALLERGY) 10 MG tablet Take 1 tablet (10 mg total) by mouth daily. (Patient not taking:  Reported on 10/21/2021) 30 tablet 0    fluticasone (FLONASE) 50 MCG/ACT nasal spray Place 2 sprays into both nostrils daily. (Patient not taking: Reported on 10/21/2021) 16 g 12    pseudoephedrine (SUDAFED) 30 MG tablet Take 1 tablet (30 mg total) by mouth every 8 (eight) hours as needed for congestion. (Patient not taking: Reported on 10/21/2021) 30 tablet 0     Review of Systems  Gastrointestinal:  Positive for abdominal pain and constipation.  Genitourinary:  Positive for vaginal bleeding. Negative for dysuria, frequency and urgency.  All other systems reviewed and are negative. Physical Exam   Blood pressure 112/69, pulse 71, temperature 98.5 F (36.9 C), resp. rate 18, last menstrual period 06/10/2021.  Physical Exam Vitals and nursing note reviewed. Exam conducted with a chaperone present.  Constitutional:      General: She is not in acute distress.    Appearance: Normal appearance. She is normal weight.  HENT:     Head: Normocephalic and atraumatic.  Abdominal:     Tenderness: There is abdominal tenderness.  Genitourinary:    General: Normal vulva.  Skin:    General: Skin is warm and dry.  Neurological:     Mental Status: She is alert and oriented to person, place, and time.    MAU Course  [place] MFM OB LIMITED  Date/Time: 11/04/2021 10:44 AM Performed by:  Levie HeritageStinson, Roshaunda Starkey J, DO Authorized by: Levie HeritageStinson, Blong Busk J, DO     MDM Patient's presentation of vaginal bleeding and LLQ+LRQ tenderness is concerning for possible placental abruption or spontaneous abortion, ordered U/S, CBC, and b-hCG. Also ordered urinalysis to evaluate for possible UTI and G/Chlamydia swab for possible STI.   ABO Rh not needed--patient is Rh+.   Assessment and Plan  Patient has remained stable in the MAU. Prelim findings on ultrasound was reassuring against spontaneous abortion but showed some increased vascularity and irregular border that may have contributed to the bleeding. U/S today is consistent with  previous scan on 5/8. Cervical length 3.7cm. Urinalysis was negative.  -Bleeding return precautions discussed with patient  -GC/Chlamydia pending -Follow up with scheduled routine [facility]    Annie Nelson 11/04/2021, 9:54 AM   Patient seen and examined in conjunction with Charolett BumpersAnnie Nelson, MS3.  I independently took history and physical exam.   Briefly, she woke this morning with bleeding after using the bathroom. She passed a few small clots and had spotting on the tissue paper. She has some lower pelvic cramping that started this morning as well.  She reports good fetal movement.  No leaking fluid.  Since arrival, she was no further bleeding.  BP 112/69   Pulse 71   Temp 98.5 F (36.9 C)   Resp 18   LMP 06/10/2021 (Exact Date)  A&OX3, NAD Regular rate Clear to auscultation Mild tenderness to the round ligaments bilaterally  Results for orders placed or performed during the hospital encounter of 11/04/21 (from the past 24 hour(s))  CBC     Status: Abnormal   Collection Time: 11/04/21  9:55 AM  Result Value Ref Range   WBC 9.7 4.0 - 10.5 K/uL   RBC 3.44 (L) 3.87 - 5.11 MIL/uL   Hemoglobin 11.0 (L) 12.0 - 15.0 g/dL   HCT 29.532.5 (L) 62.136.0 - 30.846.0 %   MCV 94.5 80.0 - 100.0 fL   MCH 32.0 26.0 - 34.0 pg   MCHC 33.8 30.0 - 36.0 g/dL   RDW 65.712.7 84.611.5 - 96.215.5 %   Platelets 303 150 - 400 K/uL   nRBC 0.0 0.0 - 0.2 %  Wet prep, genital     Status: Abnormal   Collection Time: 11/04/21  9:55 AM   Specimen: PATH Cytology Cervicovaginal Ancillary Only  Result Value Ref Range   Yeast Wet Prep HPF POC NONE SEEN NONE SEEN   Trich, Wet Prep NONE SEEN NONE SEEN   Clue Cells Wet Prep HPF POC NONE SEEN NONE SEEN   WBC, Wet Prep HPF POC >=10 (A) <10   Sperm NONE SEEN   Urinalysis, Routine w reflex microscopic Urine, Clean Catch     Status: Abnormal   Collection Time: 11/04/21 11:50 AM  Result Value Ref Range   Color, Urine YELLOW YELLOW   APPearance CLOUDY (A) CLEAR   Specific  Gravity, Urine 1.014 1.005 - 1.030   pH 8.0 5.0 - 8.0   Glucose, UA NEGATIVE NEGATIVE mg/dL   Hgb urine dipstick NEGATIVE NEGATIVE   Bilirubin Urine NEGATIVE NEGATIVE   Ketones, ur NEGATIVE NEGATIVE mg/dL   Protein, ur NEGATIVE NEGATIVE mg/dL   Nitrite NEGATIVE NEGATIVE   Leukocytes,Ua NEGATIVE NEGATIVE   Ultrasound images independently reviewed from both this ultrasound as well as from the OB detail from 5/8.  There does appear to be some hypervascularity to the placenta on both exams.  The ultrasound from today does not show any further progression  of these areas.  Cervical length measures 3.7cm.  Fetal position is vertex.  Placenta location posterior.  Cardiac activity observed.   1. Language barrier   2. Supervision of high risk pregnancy, antepartum   3. History of preterm delivery, currently pregnant   4. Previous cesarean delivery, antepartum   5. [redacted] weeks gestation of pregnancy   6. Vaginal bleeding before [redacted] weeks gestation    Patient discharged home with pelvic rest. I will have patient continue to follow-up.  Return precautions given, specifically for recurrent bleeding.  Levie Heritage, DO 11/04/2021

## 2021-11-04 NOTE — Progress Notes (Signed)
   PRENATAL VISIT NOTE  Subjective:  Heather Leonard is a 31 y.o. G2P0101 at [redacted]w[redacted]d being seen today for ongoing prenatal care.  Patient is Ukrainian-speaking only, interpreter present for this encounter.  She is currently monitored for the following issues for this high-risk pregnancy and has Supervision of high risk pregnancy, antepartum; History of preterm delivery, currently pregnant; Language barrier; and Previous cesarean delivery, antepartum on their problem list.  Patient reports  episode of vaginal bleeding, evaluated this morning in MAU .  Contractions: Not present. Vag. Bleeding: Scant.  Movement: Present. Denies leaking of fluid.   The following portions of the patient's history were reviewed and updated as appropriate: allergies, current medications, past family history, past medical history, past social history, past surgical history and problem list.   Objective:   Vitals:   11/04/21 1612  BP: 109/71  Pulse: 79  Weight: 142 lb 3.2 oz (64.5 kg)    Fetal Status: Fetal Heart Rate (bpm): 158   Movement: Present     General:  Alert, oriented and cooperative. Patient is in no acute distress.  Skin: Skin is warm and dry. No rash noted.   Cardiovascular: Normal heart rate noted  Respiratory: Normal respiratory effort, no problems with respiration noted  Abdomen: Soft, gravid, appropriate for gestational age.  Pain/Pressure: Present     Pelvic: Cervical exam deferred        Extremities: Normal range of motion.  Edema: None  Mental Status: Normal mood and affect. Normal behavior. Normal judgment and thought content.   Assessment and Plan:  Pregnancy: G2P0101 at [redacted]w[redacted]d 1. History of preterm delivery, currently pregnant 2. Vaginal bleeding in pregnancy, second trimester Negative evaluation earlier today. Ultrasound preliminary report of possible increased vascularity at edge of placenta?, final report pending. Bleeding precautions advised. Follow up scan scheduled 11/14/2021  3.  Previous cesarean delivery, antepartum Unsure of desired modality for now. Had previous cesarean section at 7 months in [place]. Unsure of what type of scar she had, has transverse skin incision. Maybe scar can be visualized on next scan?  4. [redacted] weeks gestation of pregnancy 5. Supervision of high risk pregnancy, antepartum No other concerns. Preterm labor symptoms and general obstetric precautions including but not limited to vaginal bleeding, contractions, leaking of fluid and fetal movement were reviewed in detail with the patient. Please refer to After Visit Summary for other counseling recommendations.   Return in about 4 weeks (around 12/02/2021) for OFFICE OB VISIT (MD only).  Future Appointments  Date Time Provider [place]  11/14/2021 10:30 AM [facility] NURSE [facility]  11/14/2021 10:45 AM [facility] US4 [facility] [place]    Verita Schneiders, MD

## 2021-11-04 NOTE — MAU Note (Signed)
.  Heather Leonard is a 31 y.o. at [redacted]w[redacted]d here in MAU reporting: pt reprots seh went to have BM this morning and saw blood. Thinks it is coming from her vaginal. Deneis any pain. Stated she had intercourse this morning but no penitration.   Onset of complaint: this morning. Pain score: 0 Vitals:   11/04/21 0850  BP: 112/69  Pulse: 71  Resp: 18     FHT: Lab orders placed from triage:

## 2021-11-05 LAB — GC/CHLAMYDIA PROBE AMP (~~LOC~~) NOT AT ARMC
Chlamydia: NEGATIVE
Comment: NEGATIVE
Comment: NORMAL
Neisseria Gonorrhea: NEGATIVE

## 2021-11-06 ENCOUNTER — Encounter: Payer: 59 | Admitting: Certified Nurse Midwife

## 2021-11-14 ENCOUNTER — Ambulatory Visit: Payer: Managed Care, Other (non HMO) | Admitting: *Deleted

## 2021-11-14 ENCOUNTER — Ambulatory Visit: Payer: Managed Care, Other (non HMO) | Attending: Obstetrics and Gynecology

## 2021-11-14 ENCOUNTER — Other Ambulatory Visit: Payer: Self-pay | Admitting: Obstetrics and Gynecology

## 2021-11-14 ENCOUNTER — Encounter: Payer: Self-pay | Admitting: *Deleted

## 2021-11-14 ENCOUNTER — Other Ambulatory Visit: Payer: Self-pay | Admitting: *Deleted

## 2021-11-14 VITALS — BP 114/53 | HR 85

## 2021-11-14 DIAGNOSIS — Z789 Other specified health status: Secondary | ICD-10-CM

## 2021-11-14 DIAGNOSIS — O09899 Supervision of other high risk pregnancies, unspecified trimester: Secondary | ICD-10-CM | POA: Insufficient documentation

## 2021-11-14 DIAGNOSIS — O0990 Supervision of high risk pregnancy, unspecified, unspecified trimester: Secondary | ICD-10-CM | POA: Insufficient documentation

## 2021-11-14 DIAGNOSIS — O09292 Supervision of pregnancy with other poor reproductive or obstetric history, second trimester: Secondary | ICD-10-CM | POA: Diagnosis not present

## 2021-11-14 DIAGNOSIS — O09212 Supervision of pregnancy with history of pre-term labor, second trimester: Secondary | ICD-10-CM

## 2021-11-14 DIAGNOSIS — O34219 Maternal care for unspecified type scar from previous cesarean delivery: Secondary | ICD-10-CM

## 2021-11-14 DIAGNOSIS — Z3A22 22 weeks gestation of pregnancy: Secondary | ICD-10-CM

## 2021-12-05 ENCOUNTER — Other Ambulatory Visit: Payer: Self-pay

## 2021-12-05 ENCOUNTER — Ambulatory Visit (INDEPENDENT_AMBULATORY_CARE_PROVIDER_SITE_OTHER): Payer: Medicaid Other | Admitting: Family Medicine

## 2021-12-05 VITALS — BP 107/65 | HR 67 | Wt 154.5 lb

## 2021-12-05 DIAGNOSIS — O34219 Maternal care for unspecified type scar from previous cesarean delivery: Secondary | ICD-10-CM

## 2021-12-05 DIAGNOSIS — Z789 Other specified health status: Secondary | ICD-10-CM

## 2021-12-05 DIAGNOSIS — Z3A25 25 weeks gestation of pregnancy: Secondary | ICD-10-CM

## 2021-12-05 DIAGNOSIS — O0990 Supervision of high risk pregnancy, unspecified, unspecified trimester: Secondary | ICD-10-CM

## 2021-12-05 DIAGNOSIS — O09899 Supervision of other high risk pregnancies, unspecified trimester: Secondary | ICD-10-CM

## 2021-12-11 ENCOUNTER — Encounter: Payer: Self-pay | Admitting: *Deleted

## 2021-12-25 ENCOUNTER — Other Ambulatory Visit: Payer: Self-pay

## 2021-12-25 DIAGNOSIS — O0990 Supervision of high risk pregnancy, unspecified, unspecified trimester: Secondary | ICD-10-CM

## 2021-12-29 NOTE — Progress Notes (Deleted)
   PRENATAL VISIT NOTE  Subjective:  Heather Leonard is a 31 y.o. G2P0101 at 29w***d being seen today for ongoing prenatal care.  She is currently monitored for the following issues for this high-risk pregnancy and has Supervision of high risk pregnancy, antepartum; History of preterm delivery, currently pregnant; Language barrier; and Previous cesarean delivery, antepartum on their problem list.  Patient reports {sx:14538}.   .  .   . Denies leaking of fluid.   The following portions of the patient's history were reviewed and updated as appropriate: allergies, current medications, past family history, past medical history, past social history, past surgical history and problem list.   Objective:  There were no vitals filed for this visit.  Fetal Status:           General:  Alert, oriented and cooperative. Patient is in no acute distress.  Skin: Skin is warm and dry. No rash noted.   Cardiovascular: Normal heart rate noted  Respiratory: Normal respiratory effort, no problems with respiration noted  Abdomen: Soft, gravid, appropriate for gestational age.        Pelvic: Cervical exam deferred        Extremities: Normal range of motion.     Mental Status: Normal mood and affect. Normal behavior. Normal judgment and thought content.   Assessment and Plan:  Pregnancy: G2P0101 at 29w***d 1. Supervision of high risk pregnancy, antepartum 28 wk labs today Offered and recommended tdap - pt ***  2. History of preterm delivery, currently pregnant Delivered by c-section at 33w in s/o PPROM. Most likely LTCS. Recheck [place] at 34w to look for myometrial thinning and otherwise plan for c-section at 39w.   3. Language barrier Video interpreter used - ***  4. Previous cesarean delivery, antepartum Desires repeat ***   Preterm labor symptoms and general obstetric precautions including but not limited to vaginal bleeding, contractions, leaking of fluid and fetal movement were reviewed in detail  with the patient. Please refer to After Visit Summary for other counseling recommendations.   No follow-ups on file.  Future Appointments  Date Time Provider Department Center  01/03/2022  8:20 AM [facility] LAB [facility]  01/03/2022  9:15 AM Milas Hock, MD [facility]  02/03/2022 12:30 PM [facility] NURSE [facility]  02/03/2022 12:45 PM [facility] US4 [facility]    Milas Hock, MD

## 2022-01-03 ENCOUNTER — Other Ambulatory Visit: Payer: Self-pay

## 2022-01-03 ENCOUNTER — Encounter: Payer: Managed Care, Other (non HMO) | Admitting: Obstetrics and Gynecology

## 2022-01-03 ENCOUNTER — Other Ambulatory Visit: Payer: Managed Care, Other (non HMO)

## 2022-01-03 ENCOUNTER — Ambulatory Visit (INDEPENDENT_AMBULATORY_CARE_PROVIDER_SITE_OTHER): Payer: Managed Care, Other (non HMO) | Admitting: Obstetrics and Gynecology

## 2022-01-03 VITALS — BP 105/67 | HR 72 | Wt 157.0 lb

## 2022-01-03 DIAGNOSIS — O0990 Supervision of high risk pregnancy, unspecified, unspecified trimester: Secondary | ICD-10-CM

## 2022-01-03 DIAGNOSIS — Z603 Acculturation difficulty: Secondary | ICD-10-CM

## 2022-01-03 DIAGNOSIS — I8393 Asymptomatic varicose veins of bilateral lower extremities: Secondary | ICD-10-CM

## 2022-01-03 DIAGNOSIS — Z789 Other specified health status: Secondary | ICD-10-CM

## 2022-01-03 DIAGNOSIS — Z23 Encounter for immunization: Secondary | ICD-10-CM

## 2022-01-03 DIAGNOSIS — O34219 Maternal care for unspecified type scar from previous cesarean delivery: Secondary | ICD-10-CM

## 2022-01-03 DIAGNOSIS — O09899 Supervision of other high risk pregnancies, unspecified trimester: Secondary | ICD-10-CM

## 2022-01-03 NOTE — Progress Notes (Addendum)
   PRENATAL VISIT NOTE  Subjective:  Heather Leonard is a 31 y.o. G2P0101 at [redacted]w[redacted]d being seen today for ongoing prenatal care.  She is currently monitored for the following issues for this high-risk pregnancy and has Supervision of high risk pregnancy, antepartum; History of preterm delivery, currently pregnant; Language barrier; and Previous cesarean delivery, antepartum on their problem list.  Patient reports  new veins in her legs and inner thigh discomfort. She denies LE edema and parathesias .  Contractions: Irritability. Vag. Bleeding: None.  Movement: Present. Denies leaking of fluid.   The following portions of the patient's history were reviewed and updated as appropriate: allergies, current medications, past family history, past medical history, past social history, past surgical history and problem list.   Objective:   Vitals:   01/03/22 0835  BP: 105/67  Pulse: 72  Weight: 157 lb (71.2 kg)    Fetal Status: Fetal Heart Rate (bpm): 157 Fundal Height: 29 cm Movement: Present     General:  Alert, oriented and cooperative. Patient is in no acute distress.  Skin: Skin is warm and dry. No rash noted.   Cardiovascular: Normal heart rate noted  Respiratory: Normal respiratory effort, no problems with respiration noted  Abdomen: Soft, gravid, appropriate for gestational age.  Pain/Pressure: Present     Pelvic: Cervical exam deferred        Extremities: Normal range of motion.  Edema: None, Telangiectasias most notable around lateral right knee.    Mental Status: Normal mood and affect. Normal behavior. Normal judgment and thought content.   Assessment and Plan:  Pregnancy: G2P0101 at [redacted]w[redacted]d 1. Supervision of high risk pregnancy, antepartum Doing well. Follow up with MFM 8/21. Return to [facility] care in 2 weeks.   2. Previous cesarean delivery, antepartum Planning for rLTCS.   3. History of preterm delivery, currently pregnant Doing well. Normal cervical length noted on last scan.    4. Need for Tdap vaccination Declined today. Discussed necessity. Continue ongoing discussion at subsequent visits.  5. Spider veins of both lower extremities New since last visit. Family hx. Common in pregnancy. No red flags. Discussed likely resolution following delivery.  6. Language barrier In person ukrainian interpreter used during entire encounter.    Preterm labor symptoms and general obstetric precautions including but not limited to vaginal bleeding, contractions, leaking of fluid and fetal movement were reviewed in detail with the patient. Please refer to After Visit Summary for other counseling recommendations.   Return in about 2 weeks (around 01/17/2022) for HROB follow up.  Future Appointments  Date Time Provider Department Center  02/03/2022 12:30 PM [facility]URSE [facility]  02/03/2022 12:45 PM [facility] US4 [facility]    Hargis Vandyne Autry-Lott, DO

## 2022-01-04 LAB — RPR: RPR Ser Ql: NONREACTIVE

## 2022-01-04 LAB — CBC
Hematocrit: 34.2 % (ref 34.0–46.6)
Hemoglobin: 11.7 g/dL (ref 11.1–15.9)
MCH: 32.8 pg (ref 26.6–33.0)
MCHC: 34.2 g/dL (ref 31.5–35.7)
MCV: 96 fL (ref 79–97)
Platelets: 288 10*3/uL (ref 150–450)
RBC: 3.57 x10E6/uL — ABNORMAL LOW (ref 3.77–5.28)
RDW: 12.1 % (ref 11.7–15.4)
WBC: 9.5 10*3/uL (ref 3.4–10.8)

## 2022-01-04 LAB — GLUCOSE TOLERANCE, 2 HOURS W/ 1HR
Glucose, 1 hour: 134 mg/dL (ref 70–179)
Glucose, 2 hour: 102 mg/dL (ref 70–152)
Glucose, Fasting: 84 mg/dL (ref 70–91)

## 2022-01-04 LAB — HIV ANTIBODY (ROUTINE TESTING W REFLEX): HIV Screen 4th Generation wRfx: NONREACTIVE

## 2022-01-21 ENCOUNTER — Other Ambulatory Visit: Payer: Self-pay

## 2022-01-21 ENCOUNTER — Encounter: Payer: Self-pay | Admitting: Family Medicine

## 2022-01-21 ENCOUNTER — Ambulatory Visit (INDEPENDENT_AMBULATORY_CARE_PROVIDER_SITE_OTHER): Payer: Managed Care, Other (non HMO) | Admitting: Family Medicine

## 2022-01-21 VITALS — BP 111/71 | HR 83 | Wt 161.0 lb

## 2022-01-21 DIAGNOSIS — O09899 Supervision of other high risk pregnancies, unspecified trimester: Secondary | ICD-10-CM

## 2022-01-21 DIAGNOSIS — O34219 Maternal care for unspecified type scar from previous cesarean delivery: Secondary | ICD-10-CM

## 2022-01-21 DIAGNOSIS — O0993 Supervision of high risk pregnancy, unspecified, third trimester: Secondary | ICD-10-CM

## 2022-01-21 DIAGNOSIS — O0990 Supervision of high risk pregnancy, unspecified, unspecified trimester: Secondary | ICD-10-CM

## 2022-01-21 DIAGNOSIS — O09893 Supervision of other high risk pregnancies, third trimester: Secondary | ICD-10-CM

## 2022-01-21 DIAGNOSIS — Z789 Other specified health status: Secondary | ICD-10-CM

## 2022-01-21 DIAGNOSIS — Z3A32 32 weeks gestation of pregnancy: Secondary | ICD-10-CM

## 2022-01-21 NOTE — Progress Notes (Unsigned)
Patient believes she may had 2 contractions yesterday. She describes her stomach as "hard like a stone".  Otherwise, patient is feeling fine and has no questions or concerns.  MFM follow up scheduled 02/03/22  Unice Vantassel, [facility]   01/21/22

## 2022-01-21 NOTE — Progress Notes (Signed)
   PRENATAL VISIT NOTE  Subjective:  Heather Leonard is a 31 y.o. G2P0101 at [redacted]w[redacted]d being seen today for ongoing prenatal care.  She is currently monitored for the following issues for this high-risk pregnancy and has Supervision of high risk pregnancy, antepartum; History of preterm delivery, currently pregnant; Language barrier; and Previous cesarean delivery, antepartum on their problem list.  Patient reports no complaints.  Contractions: Irritability. Vag. Bleeding: None.  Movement: Present. Denies leaking of fluid.   The following portions of the patient's history were reviewed and updated as appropriate: allergies, current medications, past family history, past medical history, past social history, past surgical history and problem list.   Objective:   Vitals:   01/21/22 1059  BP: 111/71  Pulse: 83  Weight: 161 lb (73 kg)    Fetal Status: Fetal Heart Rate (bpm): 150s   Movement: Present     General:  Alert, oriented and cooperative. Patient is in no acute distress.  Skin: Skin is warm and dry. No rash noted.   Cardiovascular: Normal heart rate noted  Respiratory: Normal respiratory effort, no problems with respiration noted  Abdomen: Soft, gravid, appropriate for gestational age.  Pain/Pressure: Absent     Pelvic: Cervical exam deferred        Extremities: Normal range of motion.     Mental Status: Normal mood and affect. Normal behavior. Normal judgment and thought content.   Assessment and Plan:  Pregnancy: G2P0101 at [redacted]w[redacted]d 1. Previous cesarean delivery, antepartum Desires RCS requested today  2. Supervision of high risk pregnancy, antepartum   3. History of preterm delivery, currently pregnant  4. Language barrier Uses video interpreter  {Blank single:19197::"Term","Preterm"} labor symptoms and general obstetric precautions including but not limited to vaginal bleeding, contractions, leaking of fluid and fetal movement were reviewed in detail with the  patient. Please refer to After Visit Summary for other counseling recommendations.   No follow-ups on file.  Future Appointments  Date Time Provider Department Center  02/03/2022 12:30 PM [facility] NURSE [facility]  02/03/2022 12:45 PM [facility] US4 [facility]    Federico Flake, MD

## 2022-01-22 ENCOUNTER — Encounter: Payer: Self-pay | Admitting: Family Medicine

## 2022-01-31 ENCOUNTER — Other Ambulatory Visit: Payer: Self-pay | Admitting: Obstetrics and Gynecology

## 2022-01-31 DIAGNOSIS — O34219 Maternal care for unspecified type scar from previous cesarean delivery: Secondary | ICD-10-CM

## 2022-02-03 ENCOUNTER — Encounter: Payer: Self-pay | Admitting: *Deleted

## 2022-02-03 ENCOUNTER — Ambulatory Visit: Payer: Managed Care, Other (non HMO) | Admitting: *Deleted

## 2022-02-03 ENCOUNTER — Ambulatory Visit: Payer: Managed Care, Other (non HMO) | Attending: Obstetrics and Gynecology

## 2022-02-03 VITALS — BP 107/58 | HR 57

## 2022-02-03 DIAGNOSIS — O34219 Maternal care for unspecified type scar from previous cesarean delivery: Secondary | ICD-10-CM | POA: Insufficient documentation

## 2022-02-03 DIAGNOSIS — Z3A34 34 weeks gestation of pregnancy: Secondary | ICD-10-CM | POA: Diagnosis not present

## 2022-02-03 DIAGNOSIS — O09293 Supervision of pregnancy with other poor reproductive or obstetric history, third trimester: Secondary | ICD-10-CM

## 2022-02-03 DIAGNOSIS — O0990 Supervision of high risk pregnancy, unspecified, unspecified trimester: Secondary | ICD-10-CM

## 2022-02-03 DIAGNOSIS — O09213 Supervision of pregnancy with history of pre-term labor, third trimester: Secondary | ICD-10-CM | POA: Diagnosis not present

## 2022-02-03 DIAGNOSIS — O09899 Supervision of other high risk pregnancies, unspecified trimester: Secondary | ICD-10-CM | POA: Insufficient documentation

## 2022-02-03 DIAGNOSIS — Z789 Other specified health status: Secondary | ICD-10-CM

## 2022-02-05 ENCOUNTER — Ambulatory Visit (INDEPENDENT_AMBULATORY_CARE_PROVIDER_SITE_OTHER): Payer: Managed Care, Other (non HMO) | Admitting: Family Medicine

## 2022-02-05 ENCOUNTER — Other Ambulatory Visit: Payer: Self-pay

## 2022-02-05 ENCOUNTER — Encounter: Payer: Self-pay | Admitting: Family Medicine

## 2022-02-05 VITALS — BP 127/75 | HR 65 | Wt 166.2 lb

## 2022-02-05 DIAGNOSIS — O34219 Maternal care for unspecified type scar from previous cesarean delivery: Secondary | ICD-10-CM

## 2022-02-05 DIAGNOSIS — O0990 Supervision of high risk pregnancy, unspecified, unspecified trimester: Secondary | ICD-10-CM

## 2022-02-05 DIAGNOSIS — O09899 Supervision of other high risk pregnancies, unspecified trimester: Secondary | ICD-10-CM

## 2022-02-05 DIAGNOSIS — Z789 Other specified health status: Secondary | ICD-10-CM

## 2022-02-05 NOTE — Patient Instructions (Signed)

## 2022-02-05 NOTE — Progress Notes (Signed)
   Subjective:  Heather Leonard is a 31 y.o. G2P0101 at [redacted]w[redacted]d being seen today for ongoing prenatal care.  She is currently monitored for the following issues for this low-risk pregnancy and has Supervision of high risk pregnancy, antepartum; History of preterm delivery, currently pregnant; Language barrier; and Previous cesarean delivery, antepartum on their problem list.  Patient reports no complaints.  Contractions: Irritability. Vag. Bleeding: None.  Movement: Present. Denies leaking of fluid.   The following portions of the patient's history were reviewed and updated as appropriate: allergies, current medications, past family history, past medical history, past social history, past surgical history and problem list. Problem list updated.  Objective:   Vitals:   02/05/22 1527  BP: 127/75  Pulse: 65  Weight: 166 lb 3.2 oz (75.4 kg)    Fetal Status: Fetal Heart Rate (bpm): 145   Movement: Present     General:  Alert, oriented and cooperative. Patient is in no acute distress.  Skin: Skin is warm and dry. No rash noted.   Cardiovascular: Normal heart rate noted  Respiratory: Normal respiratory effort, no problems with respiration noted  Abdomen: Soft, gravid, appropriate for gestational age. Pain/Pressure: Present     Pelvic: Vag. Bleeding: None     Cervical exam deferred        Extremities: Normal range of motion.     Mental Status: Normal mood and affect. Normal behavior. Normal judgment and thought content.   Urinalysis:      Assessment and Plan:  Pregnancy: G2P0101 at [redacted]w[redacted]d  1. Supervision of high risk pregnancy, antepartum BP and FHR normal  2. Previous cesarean delivery, antepartum Scheduled for 39 week elective repeat Many good questions about logistics Mom plans to get sushi right after! Hopefully grandma will come to watch their first child, if not discussed visitor policies  3. Language barrier [place]ian  4. History of preterm delivery, currently pregnant [redacted]  wk PPROM with bleeding, had cesarean delivery in [place]  Preterm labor symptoms and general obstetric precautions including but not limited to vaginal bleeding, contractions, leaking of fluid and fetal movement were reviewed in detail with the patient. Please refer to After Visit Summary for other counseling recommendations.  Return in 2 weeks (on 02/19/2022) for ob visit, [facility].   Venora Maples, MD

## 2022-02-18 ENCOUNTER — Encounter: Payer: Self-pay | Admitting: Obstetrics and Gynecology

## 2022-02-25 ENCOUNTER — Ambulatory Visit (INDEPENDENT_AMBULATORY_CARE_PROVIDER_SITE_OTHER): Payer: Managed Care, Other (non HMO) | Admitting: Family Medicine

## 2022-02-25 ENCOUNTER — Ambulatory Visit (HOSPITAL_COMMUNITY)
Admission: RE | Admit: 2022-02-25 | Discharge: 2022-02-25 | Disposition: A | Discharge status: Home / Self Care | Payer: Managed Care, Other (non HMO) | Source: Ambulatory Visit | Attending: Family Medicine | Admitting: Family Medicine

## 2022-02-25 ENCOUNTER — Other Ambulatory Visit: Payer: Self-pay

## 2022-02-25 ENCOUNTER — Other Ambulatory Visit (HOSPITAL_COMMUNITY)
Admission: RE | Admit: 2022-02-25 | Discharge: 2022-02-25 | Disposition: A | Discharge status: Home / Self Care | Payer: Managed Care, Other (non HMO) | Source: Ambulatory Visit | Attending: Family Medicine | Admitting: Family Medicine

## 2022-02-25 VITALS — BP 130/74 | HR 68 | Wt 174.6 lb

## 2022-02-25 DIAGNOSIS — M7989 Other specified soft tissue disorders: Secondary | ICD-10-CM

## 2022-02-25 DIAGNOSIS — O09899 Supervision of other high risk pregnancies, unspecified trimester: Secondary | ICD-10-CM

## 2022-02-25 DIAGNOSIS — O09893 Supervision of other high risk pregnancies, third trimester: Secondary | ICD-10-CM | POA: Insufficient documentation

## 2022-02-25 DIAGNOSIS — O0990 Supervision of high risk pregnancy, unspecified, unspecified trimester: Secondary | ICD-10-CM

## 2022-02-25 DIAGNOSIS — Z758 Other problems related to medical facilities and other health care: Secondary | ICD-10-CM

## 2022-02-25 DIAGNOSIS — Z3A37 37 weeks gestation of pregnancy: Secondary | ICD-10-CM | POA: Insufficient documentation

## 2022-02-25 DIAGNOSIS — Z789 Other specified health status: Secondary | ICD-10-CM

## 2022-02-25 DIAGNOSIS — O34219 Maternal care for unspecified type scar from previous cesarean delivery: Secondary | ICD-10-CM

## 2022-02-25 NOTE — Patient Instructions (Signed)

## 2022-02-25 NOTE — Progress Notes (Signed)
   Subjective:  Heather Leonard is a 31 y.o. G2P0101 at [redacted]w[redacted]d being seen today for ongoing prenatal care.  She is currently monitored for the following issues for this low-risk pregnancy and has Supervision of high risk pregnancy, antepartum; History of preterm delivery, currently pregnant; Language barrier; and Previous cesarean delivery, antepartum on their problem list.  Patient reports  some left leg swelling in the last few days, went to beach about three hours away recently in a car .  Contractions: Irritability. Vag. Bleeding: None.  Movement: Present. Denies leaking of fluid.   The following portions of the patient's history were reviewed and updated as appropriate: allergies, current medications, past family history, past medical history, past social history, past surgical history and problem list. Problem list updated.  Objective:   Vitals:   02/25/22 1051  BP: 130/74  Pulse: 68  Weight: 174 lb 9.6 oz (79.2 kg)    Fetal Status: Fetal Heart Rate (bpm): 141   Movement: Present     General:  Alert, oriented and cooperative. Patient is in no acute distress.  Skin: Skin is warm and dry. No rash noted.   Cardiovascular: Normal heart rate noted  Respiratory: Normal respiratory effort, no problems with respiration noted  Abdomen: Soft, gravid, appropriate for gestational age. Pain/Pressure: Present     Pelvic: Vag. Bleeding: None     Cervical exam deferred        Extremities: Normal range of motion.  Edema: None  Mental Status: Normal mood and affect. Normal behavior. Normal judgment and thought content.   Urinalysis:      Assessment and Plan:  Pregnancy: G2P0101 at [redacted]w[redacted]d  1. Supervision of high risk pregnancy, antepartum BP and FHR normal Swabs collected today - Culture, beta strep (group b only) - GC/Chlamydia probe amp ([place])not at [facility]  2. Previous cesarean delivery, antepartum Scheduled for RCS on 03/10/2022  3. Language barrier [place] interpreter  present  4. History of preterm delivery, currently pregnant PPROM at 33 weeks followed by CS  5. Left leg swelling Mild unilateral swelling of LLE Given recent travel and unilateral symptoms sent for LLE Duplex to evaluate for DVT  Term labor symptoms and general obstetric precautions including but not limited to vaginal bleeding, contractions, leaking of fluid and fetal movement were reviewed in detail with the patient. Please refer to After Visit Summary for other counseling recommendations.  Return in 1 week (on 03/04/2022) for [facility], ob visit.   Venora Maples, MD

## 2022-02-25 NOTE — Progress Notes (Signed)
Normal duplex study, neg for DVT  Clinical pool please call patient with [place] interpreter and tell her that her ultrasound was NORMAL, with no evidence of DVT

## 2022-02-25 NOTE — Patient Instructions (Signed)
Lavone Yokley  02/25/2022   Your procedure is scheduled on:  03/10/2022  Arrive at 1030 at Entrance C on [address] at [facility]  and [employer]. You are invited to use the FREE valet parking or use the Visitor's parking deck.  Pick up the phone at the desk and dial [phone].  Call this number if you have problems the morning of surgery: [phone]  Remember:   Do not eat food:(After Midnight) Desps de medianoche.  Do not drink clear liquids: (After Midnight) Desps de medianoche.  Take these medicines the morning of surgery with A SIP OF WATER:  none   Do not wear jewelry, make-up or nail polish.  Do not wear lotions, powders, or perfumes. Do not wear deodorant.  Do not shave 48 hours prior to surgery.  Do not bring valuables to the hospital.  [facility] is not   responsible for any belongings or valuables brought to the hospital.  Contacts, dentures or bridgework may not be worn into surgery.  Leave suitcase in the car. After surgery it may be brought to your room.  For patients admitted to the hospital, checkout time is 11:00 AM the day of              discharge.      Please read over the following fact sheets that you were given:     Preparing for Surgery

## 2022-02-25 NOTE — Addendum Note (Signed)
Addended by: Denyce Robert E on: 02/25/2022 11:30 AM   Modules accepted: Orders

## 2022-02-26 ENCOUNTER — Telehealth: Payer: Self-pay | Admitting: *Deleted

## 2022-02-26 ENCOUNTER — Encounter (HOSPITAL_COMMUNITY): Payer: Self-pay

## 2022-02-26 LAB — GC/CHLAMYDIA PROBE AMP (~~LOC~~) NOT AT ARMC
Chlamydia: NEGATIVE
Comment: NEGATIVE
Comment: NORMAL
Neisseria Gonorrhea: NEGATIVE

## 2022-02-26 NOTE — Pre-Procedure Instructions (Signed)
Interpreter number [phone]

## 2022-02-26 NOTE — Telephone Encounter (Addendum)
-----   Message from Venora Maples, MD sent at 02/25/2022  2:57 PM EDT ----- Normal duplex study, neg for DVT  Clinical pool please call patient with [place] interpreter and tell her that her ultrasound was NORMAL, with no evidence of DVT  9/13  1520  Called pt w/[employer] interpreter and she did not answer. A message was left stating that I am calling with non urgent test results. We will call back.

## 2022-02-27 NOTE — Telephone Encounter (Signed)
Call placed back to pt with [employer] interpreter # [phone]. Spoke with pt. Pt given results per Dr Crissie Reese. Pt verbalized understanding. Judeth Cornfield, RNC

## 2022-03-01 LAB — CULTURE, BETA STREP (GROUP B ONLY): Strep Gp B Culture: NEGATIVE

## 2022-03-06 ENCOUNTER — Ambulatory Visit (INDEPENDENT_AMBULATORY_CARE_PROVIDER_SITE_OTHER): Payer: Managed Care, Other (non HMO) | Admitting: Obstetrics and Gynecology

## 2022-03-06 ENCOUNTER — Other Ambulatory Visit: Payer: Self-pay

## 2022-03-06 VITALS — BP 131/75 | HR 76 | Wt 175.5 lb

## 2022-03-06 DIAGNOSIS — Z3A38 38 weeks gestation of pregnancy: Secondary | ICD-10-CM

## 2022-03-06 DIAGNOSIS — O34219 Maternal care for unspecified type scar from previous cesarean delivery: Secondary | ICD-10-CM

## 2022-03-06 DIAGNOSIS — Z789 Other specified health status: Secondary | ICD-10-CM

## 2022-03-06 DIAGNOSIS — O0990 Supervision of high risk pregnancy, unspecified, unspecified trimester: Secondary | ICD-10-CM

## 2022-03-06 DIAGNOSIS — O09899 Supervision of other high risk pregnancies, unspecified trimester: Secondary | ICD-10-CM

## 2022-03-06 NOTE — Progress Notes (Signed)
   PRENATAL VISIT NOTE  Subjective:  Heather Leonard is a 31 y.o. G2P0101 at [redacted]w[redacted]d being seen today for ongoing prenatal care.  She is currently monitored for the following issues for this low-risk pregnancy and has Supervision of high risk pregnancy, antepartum; History of preterm delivery, currently pregnant; Language barrier; and Previous cesarean delivery, antepartum on their problem list.  Patient reports no complaints.  Contractions: Irritability. Vag. Bleeding: None.  Movement: Present. Denies leaking of fluid.   The following portions of the patient's history were reviewed and updated as appropriate: allergies, current medications, past family history, past medical history, past social history, past surgical history and problem list.   Objective:   Vitals:   03/06/22 1028  BP: 131/75  Pulse: 76  Weight: 175 lb 8 oz (79.6 kg)    Fetal Status: Fetal Heart Rate (bpm): 154   Movement: Present     General:  Alert, oriented and cooperative. Patient is in no acute distress.  Skin: Skin is warm and dry. No rash noted.   Cardiovascular: Normal heart rate noted  Respiratory: Normal respiratory effort, no problems with respiration noted  Abdomen: Soft, gravid, appropriate for gestational age.  Pain/Pressure: Present     Pelvic: Cervical exam deferred        Extremities: Normal range of motion.  Edema: None  Mental Status: Normal mood and affect. Normal behavior. Normal judgment and thought content.   Assessment and Plan:  Pregnancy: G2P0101 at [redacted]w[redacted]d 1. Supervision of high risk pregnancy, antepartum Leaning towards IUD at her PP visit or at time of c-section  2. History of preterm delivery, currently pregnant  3. Language barrier Interpreter used  4. Previous cesarean delivery, antepartum For rpt on 9/25  Term labor symptoms and general obstetric precautions including but not limited to vaginal bleeding, contractions, leaking of fluid and fetal movement were reviewed in  detail with the patient. Please refer to After Visit Summary for other counseling recommendations.   Return in about 12 days (around 03/18/2022) for RN incision check .  Future Appointments  Date Time Provider Penelope  03/07/2022  9:30 AM [facility] PAT 1 [facility] None    Aletha Halim, MD

## 2022-03-07 ENCOUNTER — Encounter (HOSPITAL_COMMUNITY)
Admission: RE | Admit: 2022-03-07 | Discharge: 2022-03-07 | Disposition: A | Discharge status: Home / Self Care | Payer: Managed Care, Other (non HMO) | Source: Ambulatory Visit | Attending: Obstetrics & Gynecology | Admitting: Obstetrics & Gynecology

## 2022-03-07 DIAGNOSIS — Z01812 Encounter for preprocedural laboratory examination: Secondary | ICD-10-CM | POA: Insufficient documentation

## 2022-03-07 DIAGNOSIS — Z3A00 Weeks of gestation of pregnancy not specified: Secondary | ICD-10-CM | POA: Insufficient documentation

## 2022-03-07 DIAGNOSIS — O34219 Maternal care for unspecified type scar from previous cesarean delivery: Secondary | ICD-10-CM | POA: Diagnosis not present

## 2022-03-07 LAB — CBC
HCT: 33.6 % — ABNORMAL LOW (ref 36.0–46.0)
Hemoglobin: 11.6 g/dL — ABNORMAL LOW (ref 12.0–15.0)
MCH: 33.3 pg (ref 26.0–34.0)
MCHC: 34.5 g/dL (ref 30.0–36.0)
MCV: 96.6 fL (ref 80.0–100.0)
Platelets: 185 10*3/uL (ref 150–400)
RBC: 3.48 MIL/uL — ABNORMAL LOW (ref 3.87–5.11)
RDW: 11.7 % (ref 11.5–15.5)
WBC: 8.6 10*3/uL (ref 4.0–10.5)
nRBC: 0 % (ref 0.0–0.2)

## 2022-03-07 LAB — TYPE AND SCREEN
ABO/RH(D): A POS
Antibody Screen: NEGATIVE

## 2022-03-07 LAB — RPR: RPR Ser Ql: NONREACTIVE

## 2022-03-10 ENCOUNTER — Other Ambulatory Visit: Payer: Self-pay

## 2022-03-10 ENCOUNTER — Encounter (HOSPITAL_COMMUNITY)
Admission: AD | Disposition: A | Discharge status: Home / Self Care | Payer: Self-pay | Source: Home / Self Care | Attending: Obstetrics and Gynecology

## 2022-03-10 ENCOUNTER — Inpatient Hospital Stay (HOSPITAL_COMMUNITY)
Admission: AD | Admit: 2022-03-10 | Discharge: 2022-03-13 | DRG: 788 | Disposition: A | Discharge status: Home / Self Care | Payer: Managed Care, Other (non HMO) | Attending: Obstetrics and Gynecology | Admitting: Obstetrics and Gynecology

## 2022-03-10 ENCOUNTER — Encounter (HOSPITAL_COMMUNITY): Payer: Self-pay | Admitting: Obstetrics and Gynecology

## 2022-03-10 ENCOUNTER — Inpatient Hospital Stay (HOSPITAL_COMMUNITY): Payer: Managed Care, Other (non HMO) | Admitting: Anesthesiology

## 2022-03-10 DIAGNOSIS — Z3A39 39 weeks gestation of pregnancy: Secondary | ICD-10-CM

## 2022-03-10 DIAGNOSIS — O34211 Maternal care for low transverse scar from previous cesarean delivery: Secondary | ICD-10-CM | POA: Diagnosis present

## 2022-03-10 DIAGNOSIS — O34219 Maternal care for unspecified type scar from previous cesarean delivery: Secondary | ICD-10-CM | POA: Diagnosis present

## 2022-03-10 DIAGNOSIS — Z98891 History of uterine scar from previous surgery: Principal | ICD-10-CM

## 2022-03-10 DIAGNOSIS — O0990 Supervision of high risk pregnancy, unspecified, unspecified trimester: Secondary | ICD-10-CM

## 2022-03-10 SURGERY — Surgical Case
Anesthesia: Spinal | Wound class: Clean Contaminated

## 2022-03-10 MED ORDER — NALOXONE HCL 4 MG/10ML IJ SOLN: 1.0000 ug/kg/h | INTRAVENOUS | Status: DC | PRN

## 2022-03-10 MED ORDER — DIPHENHYDRAMINE HCL 50 MG/ML IJ SOLN: 12.5000 mg | INTRAMUSCULAR | Status: DC | PRN

## 2022-03-10 MED ORDER — PHENYLEPHRINE HCL-NACL 20-0.9 MG/250ML-% IV SOLN
INTRAVENOUS | Status: AC
  Filled 2022-03-10: qty 250

## 2022-03-10 MED ORDER — OXYCODONE HCL 5 MG PO TABS: 5.0000 mg | ORAL_TABLET | ORAL | Status: DC | PRN

## 2022-03-10 MED ORDER — POVIDONE-IODINE 10 % EX SWAB
2.0000 | Freq: Once | CUTANEOUS | Status: AC
  Administered 2022-03-10: 2 via TOPICAL

## 2022-03-10 MED ORDER — DEXMEDETOMIDINE HCL IN NACL 80 MCG/20ML IV SOLN
INTRAVENOUS | Status: AC
  Filled 2022-03-10: qty 20

## 2022-03-10 MED ORDER — OXYTOCIN-SODIUM CHLORIDE 30-0.9 UT/500ML-% IV SOLN
INTRAVENOUS | Status: AC
  Filled 2022-03-10: qty 500

## 2022-03-10 MED ORDER — ZOLPIDEM TARTRATE 5 MG PO TABS: 5.0000 mg | ORAL_TABLET | Freq: Every evening | ORAL | Status: DC | PRN

## 2022-03-10 MED ORDER — SODIUM CHLORIDE 0.9% FLUSH: 3.0000 mL | INTRAVENOUS | Status: DC | PRN

## 2022-03-10 MED ORDER — SENNOSIDES-DOCUSATE SODIUM 8.6-50 MG PO TABS
2.0000 | ORAL_TABLET | ORAL | Status: DC
  Administered 2022-03-11: 2 via ORAL
  Filled 2022-03-10 (×2): qty 2

## 2022-03-10 MED ORDER — COCONUT OIL OIL
1.0000 | TOPICAL_OIL | Status: DC | PRN
  Administered 2022-03-11: 1 via TOPICAL

## 2022-03-10 MED ORDER — MENTHOL 3 MG MT LOZG: 1.0000 | LOZENGE | OROMUCOSAL | Status: DC | PRN

## 2022-03-10 MED ORDER — FENTANYL CITRATE (PF) 100 MCG/2ML IJ SOLN
INTRAMUSCULAR | Status: AC
  Filled 2022-03-10: qty 2

## 2022-03-10 MED ORDER — SIMETHICONE 80 MG PO CHEW: 80.0000 mg | CHEWABLE_TABLET | ORAL | Status: DC | PRN

## 2022-03-10 MED ORDER — NALOXONE HCL 0.4 MG/ML IJ SOLN: 0.4000 mg | INTRAMUSCULAR | Status: DC | PRN

## 2022-03-10 MED ORDER — KETOROLAC TROMETHAMINE 30 MG/ML IJ SOLN
30.0000 mg | Freq: Once | INTRAMUSCULAR | Status: AC
  Administered 2022-03-10: 30 mg via INTRAVENOUS
  Filled 2022-03-10: qty 1

## 2022-03-10 MED ORDER — SODIUM CHLORIDE 0.9 % IR SOLN
Status: DC | PRN
  Administered 2022-03-10: 1000 mL

## 2022-03-10 MED ORDER — ACETAMINOPHEN 10 MG/ML IV SOLN
INTRAVENOUS | Status: DC | PRN
  Administered 2022-03-10: 1000 mg via INTRAVENOUS

## 2022-03-10 MED ORDER — DEXMEDETOMIDINE HCL IN NACL 80 MCG/20ML IV SOLN
INTRAVENOUS | Status: DC | PRN
  Administered 2022-03-10 (×2): 8 ug via BUCCAL

## 2022-03-10 MED ORDER — PHENYLEPHRINE HCL-NACL 20-0.9 MG/250ML-% IV SOLN
INTRAVENOUS | Status: DC | PRN
  Administered 2022-03-10: 60 ug/min via INTRAVENOUS

## 2022-03-10 MED ORDER — DEXAMETHASONE SODIUM PHOSPHATE 10 MG/ML IJ SOLN
INTRAMUSCULAR | Status: AC
  Filled 2022-03-10: qty 1

## 2022-03-10 MED ORDER — WITCH HAZEL-GLYCERIN EX PADS: 1.0000 | MEDICATED_PAD | CUTANEOUS | Status: DC | PRN

## 2022-03-10 MED ORDER — SODIUM CHLORIDE 0.9 % IV SOLN
12.5000 mg | Freq: Once | INTRAVENOUS | Status: AC
  Administered 2022-03-10: 12.5 mg via INTRAVENOUS
  Filled 2022-03-10: qty 0.5

## 2022-03-10 MED ORDER — OXYTOCIN-SODIUM CHLORIDE 30-0.9 UT/500ML-% IV SOLN
2.5000 [IU]/h | INTRAVENOUS | Status: AC
  Administered 2022-03-10: 2.5 [IU]/h via INTRAVENOUS
  Filled 2022-03-10: qty 500

## 2022-03-10 MED ORDER — MEASLES, MUMPS & RUBELLA VAC IJ SOLR: 0.5000 mL | Freq: Once | INTRAMUSCULAR | Status: DC

## 2022-03-10 MED ORDER — MORPHINE SULFATE (PF) 0.5 MG/ML IJ SOLN
INTRAMUSCULAR | Status: AC
  Filled 2022-03-10: qty 10

## 2022-03-10 MED ORDER — KETOROLAC TROMETHAMINE 30 MG/ML IJ SOLN
INTRAMUSCULAR | Status: AC
  Filled 2022-03-10: qty 1

## 2022-03-10 MED ORDER — DEXAMETHASONE SODIUM PHOSPHATE 10 MG/ML IJ SOLN
INTRAMUSCULAR | Status: DC | PRN
  Administered 2022-03-10: 10 mg via INTRAVENOUS

## 2022-03-10 MED ORDER — SIMETHICONE 80 MG PO CHEW
80.0000 mg | CHEWABLE_TABLET | Freq: Three times a day (TID) | ORAL | Status: DC
  Administered 2022-03-11 – 2022-03-13 (×5): 80 mg via ORAL
  Filled 2022-03-10 (×6): qty 1

## 2022-03-10 MED ORDER — LACTATED RINGERS IV SOLN: INTRAVENOUS | Status: DC

## 2022-03-10 MED ORDER — KETOROLAC TROMETHAMINE 30 MG/ML IJ SOLN
30.0000 mg | Freq: Once | INTRAMUSCULAR | Status: AC | PRN
  Administered 2022-03-10: 30 mg via INTRAVENOUS

## 2022-03-10 MED ORDER — SCOPOLAMINE 1 MG/3DAYS TD PT72
1.0000 | MEDICATED_PATCH | Freq: Once | TRANSDERMAL | Status: AC
  Administered 2022-03-10: 1.5 mg via TRANSDERMAL

## 2022-03-10 MED ORDER — FENTANYL CITRATE (PF) 100 MCG/2ML IJ SOLN
INTRAMUSCULAR | Status: DC | PRN
  Administered 2022-03-10: 15 ug via INTRATHECAL

## 2022-03-10 MED ORDER — DIPHENHYDRAMINE HCL 25 MG PO CAPS: 25.0000 mg | ORAL_CAPSULE | ORAL | Status: DC | PRN

## 2022-03-10 MED ORDER — ONDANSETRON HCL 4 MG/2ML IJ SOLN
INTRAMUSCULAR | Status: AC
  Filled 2022-03-10: qty 2

## 2022-03-10 MED ORDER — MEPERIDINE HCL 25 MG/ML IJ SOLN: 6.2500 mg | INTRAMUSCULAR | Status: DC | PRN

## 2022-03-10 MED ORDER — STERILE WATER FOR IRRIGATION IR SOLN
Status: DC | PRN
  Administered 2022-03-10: 1000 mL

## 2022-03-10 MED ORDER — ACETAMINOPHEN 10 MG/ML IV SOLN
INTRAVENOUS | Status: AC
  Filled 2022-03-10: qty 100

## 2022-03-10 MED ORDER — CEFAZOLIN SODIUM-DEXTROSE 2-4 GM/100ML-% IV SOLN
INTRAVENOUS | Status: AC
  Filled 2022-03-10: qty 100

## 2022-03-10 MED ORDER — BUPIVACAINE IN DEXTROSE 0.75-8.25 % IT SOLN
INTRATHECAL | Status: DC | PRN
  Administered 2022-03-10: 1.6 mL via INTRATHECAL

## 2022-03-10 MED ORDER — DIBUCAINE (PERIANAL) 1 % EX OINT: 1.0000 | TOPICAL_OINTMENT | CUTANEOUS | Status: DC | PRN

## 2022-03-10 MED ORDER — DIPHENHYDRAMINE HCL 25 MG PO CAPS: 25.0000 mg | ORAL_CAPSULE | Freq: Four times a day (QID) | ORAL | Status: DC | PRN

## 2022-03-10 MED ORDER — IBUPROFEN 600 MG PO TABS
600.0000 mg | ORAL_TABLET | Freq: Four times a day (QID) | ORAL | Status: DC
  Administered 2022-03-11 (×2): 600 mg via ORAL
  Filled 2022-03-10 (×2): qty 1

## 2022-03-10 MED ORDER — MORPHINE SULFATE (PF) 0.5 MG/ML IJ SOLN
INTRAMUSCULAR | Status: DC | PRN
  Administered 2022-03-10: 150 ug via INTRATHECAL

## 2022-03-10 MED ORDER — SCOPOLAMINE 1 MG/3DAYS TD PT72
MEDICATED_PATCH | TRANSDERMAL | Status: AC
  Filled 2022-03-10: qty 1

## 2022-03-10 MED ORDER — OXYTOCIN-SODIUM CHLORIDE 30-0.9 UT/500ML-% IV SOLN
INTRAVENOUS | Status: DC | PRN
  Administered 2022-03-10: 30 [IU] via INTRAVENOUS

## 2022-03-10 MED ORDER — ONDANSETRON HCL 4 MG/2ML IJ SOLN
4.0000 mg | Freq: Three times a day (TID) | INTRAMUSCULAR | Status: DC | PRN
  Administered 2022-03-10: 4 mg via INTRAVENOUS

## 2022-03-10 MED ORDER — PRENATAL MULTIVITAMIN CH
1.0000 | ORAL_TABLET | Freq: Every day | ORAL | Status: DC
  Filled 2022-03-10: qty 1

## 2022-03-10 MED ORDER — CEFAZOLIN SODIUM-DEXTROSE 2-4 GM/100ML-% IV SOLN
2.0000 g | INTRAVENOUS | Status: AC
  Administered 2022-03-10: 2 g via INTRAVENOUS

## 2022-03-10 MED ORDER — ACETAMINOPHEN 500 MG PO TABS
1000.0000 mg | ORAL_TABLET | Freq: Four times a day (QID) | ORAL | Status: DC
  Administered 2022-03-11 (×2): 1000 mg via ORAL
  Filled 2022-03-10 (×3): qty 2

## 2022-03-10 SURGICAL SUPPLY — 30 items
BENZOIN TINCTURE PRP APPL 2/3 (GAUZE/BANDAGES/DRESSINGS) ×1 IMPLANT
CHLORAPREP W/TINT 26ML (MISCELLANEOUS) ×2 IMPLANT
CLAMP UMBILICAL CORD (MISCELLANEOUS) ×1 IMPLANT
CLOTH BEACON ORANGE TIMEOUT ST (SAFETY) ×1 IMPLANT
DRSG OPSITE POSTOP 4X10 (GAUZE/BANDAGES/DRESSINGS) ×1 IMPLANT
ELECT REM PT RETURN 9FT ADLT (ELECTROSURGICAL) ×1
ELECTRODE REM PT RTRN 9FT ADLT (ELECTROSURGICAL) ×1 IMPLANT
EXTRACTOR VACUUM M CUP 4 TUBE (SUCTIONS) IMPLANT
GLOVE BIOGEL PI IND STRL 7.0 (GLOVE) ×2 IMPLANT
GLOVE BIOGEL PI IND STRL 7.5 (GLOVE) ×2 IMPLANT
GLOVE ECLIPSE 7.5 STRL STRAW (GLOVE) ×1 IMPLANT
GOWN STRL REUS W/TWL LRG LVL3 (GOWN DISPOSABLE) ×3 IMPLANT
KIT ABG SYR 3ML LUER SLIP (SYRINGE) IMPLANT
NDL HYPO 25X5/8 SAFETYGLIDE (NEEDLE) IMPLANT
NEEDLE HYPO 25X5/8 SAFETYGLIDE (NEEDLE) IMPLANT
NS IRRIG 1000ML POUR BTL (IV SOLUTION) ×1 IMPLANT
PACK C SECTION WH (CUSTOM PROCEDURE TRAY) ×1 IMPLANT
PAD OB MATERNITY 4.3X12.25 (PERSONAL CARE ITEMS) ×1 IMPLANT
RTRCTR C-SECT PINK 25CM LRG (MISCELLANEOUS) ×1 IMPLANT
STRIP CLOSURE SKIN 1/2X4 (GAUZE/BANDAGES/DRESSINGS) ×1 IMPLANT
SUT PLAIN 0 NONE (SUTURE) ×1 IMPLANT
SUT VIC AB 0 CT1 36 (SUTURE) ×3 IMPLANT
SUT VIC AB 0 CTX 36 (SUTURE) ×1
SUT VIC AB 0 CTX36XBRD ANBCTRL (SUTURE) ×1 IMPLANT
SUT VIC AB 2-0 CT1 27 (SUTURE) ×1
SUT VIC AB 2-0 CT1 TAPERPNT 27 (SUTURE) ×1 IMPLANT
SUT VIC AB 4-0 KS 27 (SUTURE) ×1 IMPLANT
TOWEL OR 17X24 6PK STRL BLUE (TOWEL DISPOSABLE) ×1 IMPLANT
TRAY FOLEY W/BAG SLVR 14FR LF (SET/KITS/TRAYS/PACK) ×1 IMPLANT
WATER STERILE IRR 1000ML POUR (IV SOLUTION) ×1 IMPLANT

## 2022-03-10 NOTE — Anesthesia Preprocedure Evaluation (Signed)
Anesthesia Evaluation  Patient identified by MRN, date of birth, ID band Patient awake    Reviewed: Allergy & Precautions, NPO status , Patient's Chart, lab work & pertinent test results  Airway Mallampati: II  TM Distance: >3 FB Neck ROM: Full    Dental no notable dental hx.    Pulmonary neg pulmonary ROS,    Pulmonary exam normal        Cardiovascular negative cardio ROS   Rhythm:Regular Rate:Normal     Neuro/Psych negative neurological ROS  negative psych ROS   GI/Hepatic negative GI ROS, Neg liver ROS,   Endo/Other  negative endocrine ROS  Renal/GU negative Renal ROS  negative genitourinary   Musculoskeletal negative musculoskeletal ROS (+)   Abdominal Normal abdominal exam  (+)   Peds  Hematology  (+) Blood dyscrasia, anemia ,   Anesthesia Other Findings   Reproductive/Obstetrics (+) Pregnancy                             Anesthesia Physical Anesthesia Plan  ASA: 2  Anesthesia Plan: Spinal   Post-op Pain Management:    Induction:   PONV Risk Score and Plan: 2 and Ondansetron and Treatment may vary due to age or medical condition  Airway Management Planned: Natural Airway, Simple Face Mask and Nasal Cannula  Additional Equipment: None  Intra-op Plan:   Post-operative Plan:   Informed Consent: I have reviewed the patients History and Physical, chart, labs and discussed the procedure including the risks, benefits and alternatives for the proposed anesthesia with the patient or authorized representative who has indicated his/her understanding and acceptance.     Dental advisory given  Plan Discussed with: CRNA  Anesthesia Plan Comments: (Lab Results      Component                Value               Date                      WBC                      8.6                 03/07/2022                HGB                      11.6 (L)            03/07/2022                 HCT                      33.6 (L)            03/07/2022                MCV                      96.6                03/07/2022                PLT                      185  03/07/2022          )        Anesthesia Quick Evaluation

## 2022-03-10 NOTE — Transfer of Care (Addendum)
Immediate Anesthesia Transfer of Care Note  Patient: Heather Leonard  Procedure(s) Performed: CESAREAN SECTION  Patient Location: PACU  Anesthesia Type:Spinal  Level of Consciousness: awake, alert , oriented and patient cooperative  Airway & Oxygen Therapy: Patient Spontanous Breathing  Post-op Assessment: Report given to RN and Post -op Vital signs reviewed and stable  Post vital signs: Reviewed and stable  Last Vitals:  Vitals Value Taken Time  BP 120/56 03/10/22 1430  Temp 36.6 C 03/10/22 1351  Pulse 46 03/10/22 1432  Resp 19 03/10/22 1432  SpO2 98 % 03/10/22 1432  Vitals shown include unvalidated device data.  Last Pain:  Vitals:   03/10/22 1415  TempSrc:   PainSc: 0-No pain         Complications: No notable events documented.

## 2022-03-10 NOTE — H&P (Signed)
LABOR AND DELIVERY ADMISSION HISTORY AND PHYSICAL NOTE  Heather Leonard is a 31 y.o. female G5P0101 with IUP at [redacted]w[redacted]d by L/19 presenting for scheduled repeat cesarean section.   She reports positive fetal movement. She denies leakage of fluid or vaginal bleeding.  Prenatal History/Complications:  Past Medical History: Past Medical History:  Diagnosis Date   Anemia    Complication of anesthesia    problems walking and moving 1 week later    Past Surgical History: Past Surgical History:  Procedure Laterality Date   CESAREAN SECTION      Obstetrical History: OB History     Gravida  2   Para  1   Term      Preterm  1   AB      Living  1      SAB      IAB      Ectopic      Multiple      Live Births  1           Social History: Social History   Socioeconomic History   Marital status: Married    Spouse name: Not on file   Number of children: Not on file   Years of education: Not on file   Highest education level: Not on file  Occupational History   Not on file  Tobacco Use   Smoking status: Never   Smokeless tobacco: Never  Vaping Use   Vaping Use: Never used  Substance and Sexual Activity   Alcohol use: Not Currently    Comment: rarely , stopped with pregnancy   Drug use: Never   Sexual activity: Not on file  Other Topics Concern   Not on file  Social History Narrative   Not on file   Social Determinants of Health   Financial Resource Strain: Not on file  Food Insecurity: No Food Insecurity (03/10/2022)   Hunger Vital Sign    Worried About Running Out of Food in the Last Year: Never true    Ran Out of Food in the Last Year: Never true  Transportation Needs: No Transportation Needs (03/10/2022)   PRAPARE - Administrator, Civil Service (Medical): No    Lack of Transportation (Non-Medical): No  Physical Activity: Not on file  Stress: Not on file  Social Connections: Not on file    Family History: Family History   Problem Relation Age of Onset   Varicose Veins Mother     Allergies: Allergies  Allergen Reactions   Cynara Scolymus (Artichoke) Rash    Per pt was artichoke extract    Medications Prior to Admission  Medication Sig Dispense Refill Last Dose   Prenatal 27-1 MG TABS Take 1 tablet by mouth daily. 30 tablet 8 03/09/2022     Review of Systems   All systems reviewed and negative except as stated in HPI  Blood pressure (P) 123/66, pulse (P) 63, resp. rate (P) 18, height 5\' 10"  (1.778 m), weight 80 kg, last menstrual period 06/10/2021, SpO2 (P) 100 %. General appearance: alert, cooperative, and appears stated age Lungs: clear to auscultation bilaterally Heart: regular rate and rhythm Abdomen: soft, non-tender; bowel sounds normal Extremities: No calf swelling or tenderness Presentation: cephalic Fetal monitoring: 160 Uterine activity: irregular     Prenatal labs: ABO, Rh: --/--/PENDING (09/25 1059) Antibody: NEG (09/22 0933) Rubella: 7.91 (03/02 1007) RPR: NON REACTIVE (09/22 0934)  HBsAg: Negative (03/02 1007)  HIV: Non Reactive (07/21 0827)  GBS: Negative/-- (09/12 1402)  2 hr Glucola: wnl Genetic screening:  wnl Anatomy US: wnl  Prenatal Transfer Tool  Maternal Diabetes: No Genetic Screening: Normal Maternal Ultrasounds/Referrals: Normal Fetal Ultrasounds or other Referrals:  None Maternal Substance Abuse:  No Significant Maternal Medications:  None Significant Maternal Lab Results: Group B Strep negative  Results for orders placed or performed during the hospital encounter of 03/10/22 (from the past 24 hour(s))  ABO/Rh   Collection Time: 03/10/22 10:59 AM  Result Value Ref Range   ABO/RH(D) PENDING     Patient Active Problem List   Diagnosis Date Noted   History of cesarean section 03/10/2022   Previous cesarean delivery, antepartum 09/05/2021   Supervision of high risk pregnancy, antepartum 08/15/2021   History of preterm delivery, currently pregnant  08/15/2021   Language barrier 08/15/2021    Assessment: Heather Leonard is a 31 y.o. G2P0101 at [redacted]w[redacted]d here for scheduled repeat cesarean section (history one prior). Patient previously counseled regarding risks/benefits of TOLAC, and I repeated that counseling today. Patient elects to proceed with elective repeat cesarean section.  The risks of cesarean section were discussed with the patient including but were not limited to: bleeding which may require transfusion or reoperation; infection which may require antibiotics; injury to bowel, bladder, ureters or other surrounding organs; injury to the fetus; need for additional procedures including hysterectomy in the event of a life-threatening hemorrhage; placental abnormalities wth subsequent pregnancies, incisional problems, thromboembolic phenomenon and other postoperative/anesthesia complications.  P Patient has been NPO since midnight she will remain NPO for procedure. Anesthesia and OR aware.  Preoperative prophylactic antibiotics and SCDs ordered on call to the OR.  To OR when ready.   # MOF: breast # Contraception: interval IUD # Circ: n/a    Sandrea Boer B Baker [place] 03/10/2022, 11:10 AM

## 2022-03-10 NOTE — Anesthesia Procedure Notes (Signed)
Spinal  Patient location during procedure: OR Start time: 03/10/2022 12:30 PM End time: 03/10/2022 12:31 PM Staffing Performed: anesthesiologist  Anesthesiologist: Darral Dash, DO Performed by: Darral Dash, DO Authorized by: Darral Dash, DO   Preanesthetic Checklist Completed: patient identified, IV checked, site marked, risks and benefits discussed, surgical consent, monitors and equipment checked, pre-op evaluation and timeout performed Spinal Block Patient position: sitting Prep: DuraPrep Patient monitoring: heart rate, cardiac monitor, continuous pulse ox and blood pressure Approach: midline Location: L4-5 Injection technique: single-shot Needle Needle type: Pencan  Needle gauge: 24 G Needle length: 10 cm Assessment Events: CSF return Additional Notes Patient identified. Risks/Benefits/Options discussed with patient including but not limited to bleeding, infection, nerve damage, paralysis, failed block, incomplete pain control, headache, blood pressure changes, nausea, vomiting, reactions to medications, itching and postpartum back pain. Confirmed with bedside nurse the patient's most recent platelet count. Confirmed with patient that they are not currently taking any anticoagulation, have any bleeding history or any family history of bleeding disorders. Patient expressed understanding and wished to proceed. All questions were answered. Sterile technique was used throughout the entire procedure. Please see nursing notes for vital signs. Warning signs of high block given to the patient including shortness of breath, tingling/numbness in hands, complete motor block, or any concerning symptoms with instructions to call for help. Patient was given instructions on fall risk and not to get out of bed. All questions and concerns addressed with instructions to call with any issues or inadequate analgesia.

## 2022-03-10 NOTE — Anesthesia Postprocedure Evaluation (Signed)
Anesthesia Post Note  Patient: Heather Leonard  Procedure(s) Performed: [place]     Patient location during evaluation: PACU Anesthesia Type: Spinal Level of consciousness: oriented and awake and alert Pain management: pain level controlled Vital Signs Assessment: post-procedure vital signs reviewed and stable Respiratory status: spontaneous breathing, respiratory function stable and patient connected to nasal cannula oxygen Cardiovascular status: blood pressure returned to baseline and stable Postop Assessment: no headache, no backache and no apparent nausea or vomiting Anesthetic complications: no   No notable events documented.  Last Vitals:  Vitals:   03/10/22 1452 03/10/22 1600  BP: 118/67 134/76  Pulse: (!) 53 (!) 49  Resp: 18 20  Temp: 36.5 C 36.7 C  SpO2: 97% 100%    Last Pain:  Vitals:   03/10/22 1600  TempSrc: Axillary  PainSc: 0-No pain                 Belenda Cruise P Kimberley Speece

## 2022-03-10 NOTE — Op Note (Signed)
Cesarean Section Operative Report  Heather Leonard  03/10/2022  Indications: history prior cesarean section, maternal request  Pre-operative Diagnosis: repeat low transverse cesarean section  Post-operative Diagnosis: Same   Surgeon: Surgeon(s) and Role:    * Heather Leonard, Heather Rud, MD - Primary    * Heather Leonard, Heather Conrad, DO - Fellow   Attending Attestation: I was present and scrubbed for the entire procedure.   An experienced assistant was required given the standard of surgical care given the complexity of the case.  This assistant was needed for exposure, dissection, suctioning, retraction, instrument exchange, assisting with delivery with administration of fundal pressure, and for overall help during the procedure.  Anesthesia: spinal    Quantified Blood Loss: 414 ml  Total IV Fluids: 1500 ml LR  Urine Output:: 100 ml clear yellow urine  Specimens: none  Findings: Viable female infant in cephalic presentation; Apgars pending; weight pending; arterial cord pH not obtained;  clear amniotic fluid; intact placenta with three vessel cord; normal uterus, fallopian tubes and ovaries bilaterally.  Baby condition / location:  Couplet care / Skin to Skin   Complications: no complications  Indications: Heather Leonard is a 31 y.o. Q6P6195 with an IUP [redacted]w[redacted]d presenting for scheduled elective repeat cesarean section.  The risks, benefits, complications, treatment options, and exected outcomes were discussed with the patient . The patient dwith the proposed plan, giving informed consent. identified as Heather Leonard and the procedure verified as C-Section Delivery.  Procedure Details:  The patient was taken back to the operative suite where spinal anesthesia was placed.  A time out was held and the above information confirmed.   After induction of anesthesia, the patient was draped and prepped in the usual sterile manner and placed in a dorsal supine position with a  leftward tilt. A Pfannenstiel incision was made and carried down through the subcutaneous tissue to the fascia. Fascial incision was made and bluntly extended transversely. The fascia was separated from the underlying rectus tissue superiorly and inferiorly. The peritoneum was identified and sharply entered and extended longitudinally. Alexis retractor was placed. A bladder flap was not created. A low transverse uterine incision was made and extended bluntly. Delivered from cephalic presentation was a viable infant with Apgars and weight as above.  After waiting 60 seconds for delayed cord cutting, the umbilical cord was clamped and cut cord blood was obtained for evaluation. Cord ph was not sent. The placenta was removed Intact and appeared normal. The uterine outline, tubes and ovaries appeared normal. The uterine incision was closed with running unlocked sutures 0-Vicryl in one layer.   Hemostasis was observed. The peritoneum was closed with 2-0 vicryl. The rectus muscles were examined and hemostasis observed. The fascia was then reapproximated with running sutures of 0-Vicryl. The skin was closed with 4-0 Vicryl.  Instrument, sponge, and needle counts were correct prior the abdominal closure and were correct at the conclusion of the case.     Disposition: PACU - hemodynamically stable.   Maternal Condition: stable       Signed: Patsy Lager WoukMD 03/10/2022 2:00 PM

## 2022-03-11 LAB — CBC
HCT: 27.4 % — ABNORMAL LOW (ref 36.0–46.0)
Hemoglobin: 9.5 g/dL — ABNORMAL LOW (ref 12.0–15.0)
MCH: 33 pg (ref 26.0–34.0)
MCHC: 34.7 g/dL (ref 30.0–36.0)
MCV: 95.1 fL (ref 80.0–100.0)
Platelets: 151 10*3/uL (ref 150–400)
RBC: 2.88 MIL/uL — ABNORMAL LOW (ref 3.87–5.11)
RDW: 11.6 % (ref 11.5–15.5)
WBC: 16.4 10*3/uL — ABNORMAL HIGH (ref 4.0–10.5)
nRBC: 0 % (ref 0.0–0.2)

## 2022-03-11 LAB — ABO/RH: ABO/RH(D): A POS

## 2022-03-11 MED ORDER — ACETAMINOPHEN 160 MG/5ML PO SOLN
1000.0000 mg | Freq: Four times a day (QID) | ORAL | Status: DC
  Administered 2022-03-11 – 2022-03-13 (×8): 1000 mg via ORAL
  Filled 2022-03-11 (×8): qty 40.6

## 2022-03-11 MED ORDER — IBUPROFEN 100 MG/5ML PO SUSP
600.0000 mg | Freq: Four times a day (QID) | ORAL | Status: DC
  Administered 2022-03-11 – 2022-03-13 (×9): 600 mg via ORAL
  Filled 2022-03-11 (×9): qty 30

## 2022-03-11 MED ORDER — COMPLETENATE 29-1 MG PO CHEW
1.0000 | CHEWABLE_TABLET | Freq: Every day | ORAL | Status: DC
  Administered 2022-03-11 – 2022-03-13 (×3): 1 via ORAL
  Filled 2022-03-11 (×3): qty 1

## 2022-03-11 NOTE — Progress Notes (Addendum)
Post Partum Day 1 Subjective: voiding and no bowel movement yet, concern of "milk let down" and poor latch   Objective: Blood pressure (!) 118/56, pulse (!) 56, temperature 99.3 F (37.4 C), temperature source Oral, resp. rate 16, height 5\' 10"  (1.778 m), weight 80 kg, last menstrual period 06/10/2021, SpO2 98 %, unknown if currently breastfeeding.  Physical Exam:  General: alert and cooperative Lochia: appropriate Uterine Fundus: firm Incision: honeycomb in place without surrounding erythema DVT Evaluation: No evidence of DVT seen on physical exam.  Recent Labs    03/11/22 0510  HGB 9.5*  HCT 27.4*    Assessment/Plan: Plan for discharge tomorrow, Breastfeeding, and Contraception Discussed IUD for interval protection. Patient desires.   #Lactation: Consult for concerns of poor latch, no tongue tie noted on exam of infant, good suckling reflex however infant effort is increased and fussiness increasing #Febrile: Current temperature 99.3 with moderately increased WBC. Monitor fever throughout the day, if symptomatic will add amp/gent for endometritis coverage given most likely nidus.    LOS: 1 day   Deloria Lair, DO 03/11/2022, 7:35 AM   Attestation of Supervision of Student:  I confirm that I have verified the information documented in the  resident  student's note and that I have also personally reperformed the history, physical exam and all medical decision making activities.  I have verified that all services and findings are accurately documented in this student's note; and I agree with management and plan as outlined in the documentation. I have also made any necessary editorial changes.    [place], [place] for [name], [place] [employer] 03/11/2022 10:53 AM

## 2022-03-11 NOTE — Lactation Note (Signed)
This note was copied from a baby's chart. Lactation Consultation Note  Patient Name: Heather Leonard GEZMO'Q Date: 03/11/2022 Reason for consult: Follow-up assessment;RN request;Term Age:32 hours Rn wanted LC to talk w/parents d/t mom is exhausted and not sleeping d/t baby cluster feeding all the time. Mom didn't want any formula or bottles per RN. LC suggested spoon or cup feeding DM mom declines. Dad translating for mom. Explained cluster feeding. Baby is having a lot of voids and stools so that is a good sign she is getting colostrum. Mom's milk should come in 3-5 days. Mom thanked me for checking but declined DM.  Maternal Data    Feeding    LATCH Score                    Lactation Tools Discussed/Used    Interventions    Discharge    Consult Status Consult Status: Follow-up Date: 03/12/22 Follow-up type: In-patient    Theodoro Kalata 03/11/2022, 9:34 PM

## 2022-03-11 NOTE — Lactation Note (Signed)
This note was copied from a baby's chart. Lactation Consultation Note  Patient Name: Heather Leonard Date: 03/11/2022 Reason for consult: Initial assessment Age:32 hours  Father interpreting for family.  P2, First child born early and was in NICU.  Mother pumped and formula fed, did try breastfeeding. Father concerned because baby wanted to breastfeed frequently last night.  Provided education. Mother hand expressed good flow prior to latch. R nipple sore.  Discussed maintaining depth and provided mother with coconut oil. Feed on demand with cues.  Goal 8-12+ times per day after first 24 hrs.  Place baby STS if not cueing.  Observed latch on both breasts.   Maternal Data Has patient been taught Hand Expression?: Yes Does the patient have breastfeeding experience prior to this delivery?: Yes How long did the patient breastfeed?:  (a few weeks)  Feeding Mother's Current Feeding Choice: Breast Milk  LATCH Score Latch: Grasps breast easily, tongue down, lips flanged, rhythmical sucking.  Audible Swallowing: A few with stimulation  Type of Nipple: Everted at rest and after stimulation  Comfort (Breast/Nipple): Filling, red/small blisters or bruises, mild/mod discomfort  Hold (Positioning): Assistance needed to correctly position infant at breast and maintain latch.  LATCH Score: 7   Interventions Interventions: Breast feeding basics reviewed;Assisted with latch;Skin to skin;Hand express;Breast compression;Adjust position;Support pillows;Education;LC Services brochure  Discharge    Consult Status Consult Status: Follow-up Date: 03/12/22 Follow-up type: In-patient    Vivianne Master [facility] 03/11/2022, 9:32 AM

## 2022-03-12 MED ORDER — ACETAMINOPHEN 160 MG/5ML PO SOLN: 1000.0000 mg | Freq: Four times a day (QID) | ORAL | 0 refills | Status: DC

## 2022-03-12 MED ORDER — SENNOSIDES-DOCUSATE SODIUM 8.6-50 MG PO TABS: 2.0000 | ORAL_TABLET | ORAL | 0 refills | Status: DC

## 2022-03-12 MED ORDER — OXYCODONE HCL 5 MG PO TABS: 5.0000 mg | ORAL_TABLET | ORAL | 0 refills | Status: DC | PRN

## 2022-03-12 MED ORDER — IBUPROFEN 800 MG PO TABS: 800.0000 mg | ORAL_TABLET | Freq: Three times a day (TID) | ORAL | 0 refills | Status: DC | PRN

## 2022-03-12 NOTE — Lactation Note (Signed)
This note was copied from a baby's chart. Lactation Consultation Note  Patient Name: Heather Leonard [id number] Date: 03/12/2022 Reason for consult: Follow-up assessment;Infant weight loss;Nipple pain/trauma;Breastfeeding assistance Lorriane Shire interpreter Carmell [place] [phone] online present. Mom awake, baby asleep. LC reviewed the [place] plan to help with sore nipples, increasing weight and enhancing milk production. ( 9% weight loss).) Age:19 hours Its only been about 2-1/2 hours, and baby sleepy. Attempted to feed , baby sleepy . Baby STS and mom aware to wake up baby by 3 hours and follow the [facility] plan .  Mom  expressed she understood the [place] plan to protect the baby's weight, and increase milk supply and tx her sore nipples.  Maternal Data Has patient been taught Hand Expression?: Yes (LC reviewed, no clostrum noted prior to latch) How long did the patient breastfeed?: per birth parent 1st baby was in NICU /  Feeding Mother's Current Feeding Choice: Breast Milk and Formula Nipple Type: Extra Slow Flow  LATCH Score Latch: Too sleepy or reluctant, no latch achieved, no sucking elicited.  Audible Swallowing: None  Type of Nipple: Everted at rest and after stimulation  Comfort (Breast/Nipple): Filling, red/small blisters or bruises, mild/mod discomfort  Hold (Positioning): Assistance needed to correctly position infant at breast and maintain latch.  LATCH Score: 4   Lactation Tools Discussed/Used Tools: Flanges;Coconut oil Flange Size: 21 Breast pump type: Double-Electric Breast Pump Pump Education: Setup, frequency, and cleaning;Milk Storage Reason for Pumping: due to 9 % weight loss, sore nipple  Interventions Interventions: Breast feeding basics reviewed;Assisted with latch;Skin to skin;Hand express;Adjust position;Support pillows;Position options;Coconut oil;DEBP;Education  Discharge Pump: Hands Free;Personal  Consult Status Consult Status: Follow-up Date:  03/13/22 Follow-up type: In-patient    [place] 03/12/2022, 4:27 PM

## 2022-03-12 NOTE — Progress Notes (Signed)
Post Partum Day 3 Subjective: Patient would like to stay with baby - Discharge order cancelled as baby will be staying per peds for feeding concern  Objective: Blood pressure 121/85, pulse (!) 55, temperature 98.2 F (36.8 C), temperature source Oral, resp. rate 18, height 5\' 10"  (1.778 m), weight 80 kg, last menstrual period 06/10/2021, SpO2 100 %, unknown if currently breastfeeding.  Physical Exam:  Please see DC summary for updated PE  Recent Labs    03/11/22 0510  HGB 9.5*  HCT 27.4*    Assessment/Plan: Plan for discharge tomorrow   LOS: 2 days   Deloria Lair, DO 03/12/2022, 2:01 PM

## 2022-03-12 NOTE — Discharge Summary (Signed)
Postpartum Discharge Summary  Date of Service updated 03/12/22\    Patient Name: Heather Leonard DOB: 02-Oct-1990 MRN: [id number]  Date of admission: 03/10/2022 Delivery date:03/10/2022  Delivering provider: Laurey Arrow [facility]  Date of discharge: 03/12/2022  Admitting diagnosis: History of cesarean section [Z98.891] Intrauterine pregnancy: [redacted]w[redacted]d    Secondary diagnosis:  Principal Problem:   History of cesarean section Active Problems:   Supervision of high risk pregnancy, antepartum   Previous cesarean delivery, antepartum  Additional problems: n/a    Discharge diagnosis: Term Pregnancy Delivered                                              Post partum procedures: n/a Augmentation: N/A Complications: None  Hospital course: Sceduled C/S   31y.o. yo G2P1102 at 32w0das admitted to the hospital 03/10/2022 for scheduled cesarean section with the following indication:Elective Repeat.Delivery details are as follows:  Membrane Rupture Time/Date: 12:54 PM ,03/10/2022   Delivery Method:C-Section, Low Transverse  Details of operation can be found in separate operative note.  Patient had an uncomplicated postpartum course.  She is ambulating, tolerating a regular diet, passing flatus, and urinating well. Patient is discharged home in stable condition on  03/12/22        Newborn Data: Birth date:03/10/2022  Birth time:12:55 PM  Gender:Female  Living status:Living  Apgars:9 ,9  Weight:3500 g     Magnesium Sulfate received: No BMZ received: No Rhophylac:N/A MMR:N/A T-DaP: declined Flu: N/A Transfusion:No  Physical exam  Vitals:   03/11/22 0622 03/11/22 1427 03/11/22 2001 03/12/22 0610  BP: (!) 118/56 123/63 124/70 121/85  Pulse: (!) 56 (!) 57 62 (!) 55  Resp: 16 20 18 18   Temp: 99.3 F (37.4 C) 98.2 F (36.8 C) 98.4 F (36.9 C) 98.2 F (36.8 C)  TempSrc: Oral Oral Oral Oral  SpO2: 98% 97% 99% 100%  Weight:      Height:       General: alert, cooperative, and no  distress Lochia: appropriate Uterine Fundus: firm Incision: Dressing is clean, dry, and intact DVT Evaluation: No evidence of DVT seen on physical exam. Labs: Lab Results  Component Value Date   WBC 16.4 (H) 03/11/2022   HGB 9.5 (L) 03/11/2022   HCT 27.4 (L) 03/11/2022   MCV 95.1 03/11/2022   PLT 151 03/11/2022       No data to display         Edinburgh Score:    03/11/2022    8:17 AM  Edinburgh Postnatal Depression Scale Screening Tool  I have been able to laugh and see the funny side of things. 0  I have looked forward with enjoyment to things. 0  I have blamed myself unnecessarily when things went wrong. 0  I have been anxious or worried for no good reason. 0  I have felt scared or panicky for no good reason. 0  Things have been getting on top of me. 0  I have been so unhappy that I have had difficulty sleeping. 0  I have felt sad or miserable. 0  I have been so unhappy that I have been crying. 0  The thought of harming myself has occurred to me. 0  Edinburgh Postnatal Depression Scale Total 0     After visit meds:  Allergies as of 03/12/2022       Reactions   Cynara Scolymus (  artichoke) Rash   Per pt was artichoke extract        Medication List     STOP taking these medications    Prenatal 27-1 MG Tabs       TAKE these medications    acetaminophen 160 MG/5ML solution Commonly known as: TYLENOL Take 31.3 mLs (1,000 mg total) by mouth every 6 (six) hours.   ibuprofen 800 MG tablet Commonly known as: ADVIL Take 1 tablet (800 mg total) by mouth every 8 (eight) hours as needed.   oxyCODONE 5 MG immediate release tablet Commonly known as: Oxy IR/ROXICODONE Take 1 tablet (5 mg total) by mouth every 4 (four) hours as needed for moderate pain.   senna-docusate 8.6-50 MG tablet Commonly known as: Senokot-S Take 2 tablets by mouth daily.               Discharge Care Instructions  (From admission, onward)           Start     Ordered    03/12/22 0000  Discharge wound care:       Comments: C-section wound care: You may feel pain/discomfort/burning sensation for several weeks. Keep the wound area clean by washing it with mild soap and water. You don't need to scrub it. Often, just letting the water run over your wound in the shower is enough. We do not recommend creams as this can cause infection, but we do recommend oral medication (prescribed), pressure dressings and running warm water on the wound to help ease discomfort/burning pain. Remove bandage in 3 days.   03/12/22 0820             Discharge home in stable condition Infant Feeding: Bottle and Breast Infant Disposition:home with mother Discharge instruction: per After Visit Summary and Postpartum booklet. Activity: Advance as tolerated. Pelvic rest for 6 weeks.  Diet: routine diet Future Appointments: Future Appointments  Date Time Provider [place]  03/18/2022 10:00 AM [facility] NURSE [facility]   Follow up Visit: Message sent 03/12/22  Please schedule this patient for a In person postpartum visit in 6 weeks with the following provider: Any provider. Additional Postpartum F/U:Incision check 1 week  Low risk pregnancy complicated by:  n/a Delivery mode:  C-Section, Low Transverse  Anticipated Birth Control:  Unsure   03/12/2022 Shelda Pal, DO

## 2022-03-12 NOTE — Lactation Note (Signed)
This note was copied from a baby's chart. Lactation Consultation Note  Patient Name: Girl Annis Lagoy [id number] Date: 03/12/2022 Reason for consult: Infant weight loss;Follow-up assessment;Term;Breastfeeding assistance;Nipple pain/trauma (9 % weight loss, dad interpreted [place]. Baby will be staying due to weight loss, and need for assistance to pump . Breast feeding help.) Age:31 years  Maternal Data Has patient been taught Hand Expression?: Yes (LC reviewed, no clostrum noted prior to latch) How long did the patient breastfeed?: per birth parent 1st baby was in NICU /  Feeding Mother's Current Feeding Choice: Breast Milk and Formula Nipple Type: Extra Slow Flow  LATCH Score Latch: Repeated attempts needed to sustain latch, nipple held in mouth throughout feeding, stimulation needed to elicit sucking reflex.  Audible Swallowing: A few with stimulation  Type of Nipple: Everted at rest and after stimulation  Comfort (Breast/Nipple): Soft / non-tender  Hold (Positioning): Assistance needed to correctly position infant at breast and maintain latch.  LATCH Score: 7   Lactation Tools Discussed/Used Tools:  (plan is to set up the DEBP to enhance the milk coming in)  Interventions Interventions: Breast feeding basics reviewed;Assisted with latch;Skin to skin;Breast massage;Hand express;Breast compression;Adjust position;Support pillows;Position options;Education  Discharge    Consult Status Consult Status: Follow-up Date: 03/12/22 Follow-up type: In-patient    [place] 03/12/2022, 2:46 PM

## 2022-03-13 ENCOUNTER — Other Ambulatory Visit (HOSPITAL_COMMUNITY): Payer: Self-pay

## 2022-03-13 MED ORDER — OXYCODONE HCL 5 MG PO TABS
5.0000 mg | ORAL_TABLET | ORAL | 0 refills | Status: AC | PRN
  Filled 2022-03-13: qty 30, 5d supply, fill #0

## 2022-03-13 MED ORDER — SENNOSIDES-DOCUSATE SODIUM 8.6-50 MG PO TABS
2.0000 | ORAL_TABLET | ORAL | 0 refills | Status: DC
  Filled 2022-03-13: qty 30, 15d supply, fill #0

## 2022-03-13 MED ORDER — IBUPROFEN 800 MG PO TABS
800.0000 mg | ORAL_TABLET | Freq: Three times a day (TID) | ORAL | 0 refills | Status: DC | PRN
  Filled 2022-03-13: qty 30, 10d supply, fill #0

## 2022-03-13 MED ORDER — ACETAMINOPHEN 325 MG PO TABS
650.0000 mg | ORAL_TABLET | Freq: Four times a day (QID) | ORAL | 0 refills | Status: DC | PRN
  Filled 2022-03-13: qty 30, 4d supply, fill #0

## 2022-03-13 NOTE — Discharge Summary (Signed)
Postpartum Discharge Summary  Date of Service updated 03/13/22    Patient Name: Heather Leonard DOB: 11/03/1990 MRN: [id number]  Date of admission: 03/10/2022 Delivery date:03/10/2022  Delivering provider: WOUK, NOAH BEDFORD  Date of discharge: 03/13/2022  Admitting diagnosis: History of cesarean section [Z98.891] Intrauterine pregnancy: [redacted]w[redacted]d     Secondary diagnosis:  Principal Problem:   History of cesarean section Active Problems:   Supervision of high risk pregnancy, antepartum   Previous cesarean delivery, antepartum  Additional problems: n/a    Discharge diagnosis: Term Pregnancy Delivered                                              Post partum procedures: n/a Augmentation: N/A Complications: None  Hospital course: Sceduled C/S   31 y.o. yo G2P1102 at [redacted]w[redacted]d was admitted to the hospital 03/10/2022 for scheduled cesarean section with the following indication:Elective Repeat.Delivery details are as follows:  Membrane Rupture Time/Date: 12:54 PM ,03/10/2022   Delivery Method:C-Section, Low Transverse  Details of operation can be found in separate operative note.  Patient had an uncomplicated postpartum course. Decried to stay another day on 03/12/22 due to baby needing to stay. She is ambulating, tolerating a regular diet, passing flatus, and urinating well. Patient is discharged home in stable condition on  03/13/22        Newborn Data: Birth date:03/10/2022  Birth time:12:55 PM  Gender:Female  Living status:Living  Apgars:9 ,9  Weight:3500 g     Magnesium Sulfate received: No BMZ received: No Rhophylac:N/A MMR:N/A T-DaP: declined Flu: N/A Transfusion:No  Physical exam  Vitals:   03/11/22 2001 03/12/22 0610 03/12/22 1125 03/13/22 0559  BP: 124/70 121/85 122/71 134/82  Pulse: 62 (!) 55 69 (!) 58  Resp: 18 18 18 17  Temp: 98.4 F (36.9 C) 98.2 F (36.8 C) 98.8 F (37.1 C) 98.9 F (37.2 C)  TempSrc: Oral Oral Oral Oral  SpO2: 99% 100%  99%  Weight:       Height:       General: alert, cooperative, and no distress Lochia: appropriate Uterine Fundus: firm Incision: Dressing is clean, dry, and intact DVT Evaluation: No evidence of DVT seen on physical exam. Labs: Lab Results  Component Value Date   WBC 16.4 (H) 03/11/2022   HGB 9.5 (L) 03/11/2022   HCT 27.4 (L) 03/11/2022   MCV 95.1 03/11/2022   PLT 151 03/11/2022       No data to display         Edinburgh Score:    03/11/2022    8:17 AM  Edinburgh Postnatal Depression Scale Screening Tool  I have been able to laugh and see the funny side of things. 0  I have looked forward with enjoyment to things. 0  I have blamed myself unnecessarily when things went wrong. 0  I have been anxious or worried for no good reason. 0  I have felt scared or panicky for no good reason. 0  Things have been getting on top of me. 0  I have been so unhappy that I have had difficulty sleeping. 0  I have felt sad or miserable. 0  I have been so unhappy that I have been crying. 0  The thought of harming myself has occurred to me. 0  Edinburgh Postnatal Depression Scale Total 0     After visit meds:  Allergies as of 03/13/2022         Reactions   Cynara Scolymus (artichoke) Rash   Per pt was artichoke extract        Medication List     STOP taking these medications    Prenatal 27-1 MG Tabs       TAKE these medications    acetaminophen 325 MG tablet Commonly known as: Tylenol Take 2 tablets (650 mg total) by mouth every 6 (six) hours as needed.   ibuprofen 800 MG tablet Commonly known as: ADVIL Take 1 tablet (800 mg total) by mouth every 8 (eight) hours as needed.   oxyCODONE 5 MG immediate release tablet Commonly known as: Oxy IR/ROXICODONE Take 1 tablet (5 mg total) by mouth every 4 (four) hours as needed for up to 7 days for moderate pain.   senna-docusate 8.6-50 MG tablet Commonly known as: Senokot-S Take 2 tablets by mouth daily.               Discharge Care  Instructions  (From admission, onward)           Start     Ordered   03/12/22 0000  Discharge wound care:       Comments: C-section wound care: You may feel pain/discomfort/burning sensation for several weeks. Keep the wound area clean by washing it with mild soap and water. You don't need to scrub it. Often, just letting the water run over your wound in the shower is enough. We do not recommend creams as this can cause infection, but we do recommend oral medication (prescribed), pressure dressings and running warm water on the wound to help ease discomfort/burning pain. Remove bandage in 3 days.   03/12/22 0820             Discharge home in stable condition Infant Feeding: Bottle and Breast Infant Disposition:home with mother Discharge instruction: per After Visit Summary and Postpartum booklet. Activity: Advance as tolerated. Pelvic rest for 6 weeks.  Diet: routine diet Future Appointments: Future Appointments  Date Time Provider [place]  03/18/2022 10:00 AM [facility] NURSE [facility] [place]  04/22/2022  9:35 AM Aletha Halim, MD [facility]   Follow up Visit: Message sent 03/13/22  Please schedule this patient for a In person postpartum visit in 6 weeks with the following provider: Any provider. Additional Postpartum F/U:Incision check 1 week  Low risk pregnancy complicated by:  n/a Delivery mode:  C-Section, Low Transverse  Anticipated Birth Control:  Unsure   03/13/2022 Shelda Pal, DO

## 2022-03-13 NOTE — Lactation Note (Signed)
This note was copied from a baby's chart. Lactation Consultation Note  Patient Name: Heather Leonard [id number] Date: 03/13/2022 Reason for consult: Follow-up assessment;Nipple pain/trauma;Infant weight loss Age:32 hours  Father interpreted.  Mother pumped 8 ml today. P2, Mother has bilateral positional stripes.  Provided mother with comfort gels. Mother requested that we try nipple shield. Nipple shield attempted but it did not help mother with pain. Mother unable at this time to attempt breastfeeding without NS.  Encouraged mother to pump q 3 hours. Reviewed engorgement care and monitoring voids/stools. Sent epic message for OP appt. Feeding Mother's Current Feeding Choice: Breast Milk and Formula   Lactation Tools Discussed/Used Tools: Pump;Comfort gels;Coconut oil;Nipple Francine Graven Size: 21 Breast pump type: Double-Electric Breast Pump Reason for Pumping: sore  Interventions Interventions: Assisted with latch;Skin to skin;Comfort gels;DEBP;Education  Discharge Discharge Education: Engorgement and breast care;Warning signs for feeding baby;Outpatient Epic message sent  Consult Status Consult Status: Complete Date: 03/13/22    Vivianne Master [facility] 03/13/2022, 1:16 PM

## 2022-03-13 NOTE — Lactation Note (Signed)
This note was copied from a baby's chart. Lactation Consultation Note  Patient Name: Heather Leonard [id number] Date: 03/13/2022 Reason for consult: Follow-up assessment;Nipple pain/trauma;Infant weight loss Age:32 hours   Parent c/o pain.  Compression stripes on nipple.  Parent had recently fed formula.   Baby sleeping.    Rolette encouraged parent to call for OP follow up with Nonah Mattes at [facility].  Parent goes there as well for care.  Dad has information to set up appointment.  Parent asked about NS for pain reduction.   LC explained NS needed to be fitted and observed.   Parent will call out for [facility] for next feeding.   Parent encouraged to pump if baby is receiving a bottle. Pumping 8 X daily , every 3 hours, to stimulate milk supply and feed milk back to baby.  Strongly encouraged follow up to assist with latching and advising on pumping.     Maternal Data Has patient been taught Hand Expression?: Yes  Feeding Mother's Current Feeding Choice: Breast Milk and Formula  LATCH Score                    Lactation Tools Discussed/Used Tools: Pump;Comfort gels;Coconut oil;Nipple Francine Graven Size: 21 Breast pump type: Double-Electric Breast Pump Pump Education: Setup, frequency, and cleaning Reason for Pumping: sore  Interventions Interventions: Assisted with latch;Skin to skin;Comfort gels;DEBP;Education  Discharge Discharge Education: Engorgement and breast care;Warning signs for feeding baby;Outpatient Epic message sent Pump: Personal (mom cosy)  Consult Status Consult Status: Complete Date: 03/13/22 Follow-up type: In-patient    Ferne Coe [facility] 03/13/2022, 1:27 PM

## 2022-03-18 ENCOUNTER — Other Ambulatory Visit: Payer: Self-pay

## 2022-03-18 ENCOUNTER — Ambulatory Visit (INDEPENDENT_AMBULATORY_CARE_PROVIDER_SITE_OTHER): Payer: Managed Care, Other (non HMO)

## 2022-03-18 VITALS — BP 120/72 | HR 66 | Wt 157.9 lb

## 2022-03-18 DIAGNOSIS — Z5189 Encounter for other specified aftercare: Secondary | ICD-10-CM

## 2022-03-18 NOTE — Progress Notes (Signed)
Incision Check Visit  Heather Leonard is here for incision check following repeat c-section on 03/10/22. Honeycomb dressing and steri strips removed. Incision is clean, dry, and intact. Bruising evident in area surrounding incision. Reviewed good wound care and s/s of infection with patient.   Patient inquired about lactation services. First available appt with [name] RN IBCLC is 03/27/22; pt agreeable to schedule this today. Pt states baby is latching well. Pt is not experiencing any breast or nipple pain. Pt is concerned that she is not producing enough milk. Describes feeding baby at the breast until baby stops eating or falls asleep and then pumping remaining breast milk. Patient will feed baby with formula if baby is still showing hunger cues. Congratulated patient on her success so far with breastfeeding. Pt states baby sleeps for 1.5 hours after breastfeeding only and 3 hours after a feeding that includes formula. Pt states baby falls asleep at the breast, but will continue to feed if woken up. Encouraged pt to have baby skin to skin while feeding and to try feeding baby without clothing. Recommended patient stimulate baby throughout feeding session when baby is falling asleep. Also reviewed paced bottle feeding for when giving formula. Encouraged good hydration. Pt will call to follow up with lactation consultant via phone if she has any other concerns prior to appt.  Encounter completed with [employer] interpreter Docia Furl ID [phone] present virtually.  [name], RN 03/18/2022  10:52 AM

## 2022-04-22 ENCOUNTER — Encounter: Payer: Self-pay | Admitting: Advanced Practice Midwife

## 2022-04-22 ENCOUNTER — Ambulatory Visit (INDEPENDENT_AMBULATORY_CARE_PROVIDER_SITE_OTHER): Payer: Managed Care, Other (non HMO) | Admitting: Advanced Practice Midwife

## 2022-04-22 ENCOUNTER — Other Ambulatory Visit: Payer: Self-pay

## 2022-04-22 ENCOUNTER — Other Ambulatory Visit (HOSPITAL_COMMUNITY)
Admission: RE | Admit: 2022-04-22 | Discharge: 2022-04-22 | Disposition: A | Discharge status: Home / Self Care | Payer: Managed Care, Other (non HMO) | Source: Ambulatory Visit | Attending: Obstetrics and Gynecology | Admitting: Obstetrics and Gynecology

## 2022-04-22 VITALS — BP 109/67 | HR 56

## 2022-04-22 DIAGNOSIS — N898 Other specified noninflammatory disorders of vagina: Secondary | ICD-10-CM | POA: Diagnosis present

## 2022-04-22 DIAGNOSIS — R300 Dysuria: Secondary | ICD-10-CM | POA: Diagnosis not present

## 2022-04-22 DIAGNOSIS — N760 Acute vaginitis: Secondary | ICD-10-CM

## 2022-04-22 DIAGNOSIS — Z3009 Encounter for other general counseling and advice on contraception: Secondary | ICD-10-CM

## 2022-04-22 DIAGNOSIS — B9689 Other specified bacterial agents as the cause of diseases classified elsewhere: Secondary | ICD-10-CM

## 2022-04-22 LAB — POCT URINALYSIS DIP (DEVICE)
Bilirubin Urine: NEGATIVE
Glucose, UA: NEGATIVE mg/dL
Hgb urine dipstick: NEGATIVE
Ketones, ur: NEGATIVE mg/dL
Leukocytes,Ua: NEGATIVE
Nitrite: NEGATIVE
Protein, ur: NEGATIVE mg/dL
Specific Gravity, Urine: >=1.03 (ref 1.005–1.030)
Urobilinogen, UA: 0.2 mg/dL (ref 0.0–1.0)
pH: 5.5 (ref 5.0–8.0)

## 2022-04-22 NOTE — Progress Notes (Signed)
Post Partum Visit Note  Heather Leonard is a 31 y.o. G63P1102 female who presents for a postpartum visit. She is 6 weeks postpartum following a repeat cesarean section.  I have fully reviewed the prenatal and intrapartum course. The delivery was at 39 gestational weeks.  Anesthesia: spinal. Postpartum course has been going well. Baby is doing well. Baby is feeding by both breast and bottle - Similac Advance. Bleeding no bleeding. Bowel function is normal. Bladder function is normal. Patient had unprotected IC 04/19/22. Contraception method is  would like to discuss IUD today . Postpartum depression screening: negative.  C/O burning with urination. Denies hematuria.  Upstream - 04/22/22 2044       Pregnancy Intention Screening   Does the patient want to become pregnant in the next year? No    Does the patient's partner want to become pregnant in the next year? No    Would the patient like to discuss contraceptive options today? Yes      Contraception Wrap Up   Current Method No Contraceptive Precautions    End Method IUD or IUS   ParaGard   Contraception Counseling Provided Yes    How was the end contraceptive method provided? N/A   Scheduled in two weeks due to uncertain if not pregnant           The pregnancy intention screening data noted above was reviewed. Potential methods of contraception were discussed. The patient elected to proceed with IUD or IUS (ParaGard).   Edinburgh Postnatal Depression Scale - 04/22/22 0930       Edinburgh Postnatal Depression Scale:  In the Past 7 Days   I have been able to laugh and see the funny side of things. 0    I have looked forward with enjoyment to things. 0    I have blamed myself unnecessarily when things went wrong. 0    I have been anxious or worried for no good reason. 0    I have felt scared or panicky for no good reason. 0    Things have been getting on top of me. 0    I have been so unhappy that I have had difficulty  sleeping. 0    I have felt sad or miserable. 0    I have been so unhappy that I have been crying. 0    The thought of harming myself has occurred to me. 0    Edinburgh Postnatal Depression Scale Total 0            There are no preventive care reminders to display for this patient.  The following portions of the patient's history were reviewed and updated as appropriate: allergies, current medications, past family history, past medical history, past social history, past surgical history, and problem list.  Review of Systems Pertinent items are noted in HPI.  Objective:  BP 109/67   Pulse (!) 56    General:  alert, cooperative, appears stated age, and no distress   Breasts:  not indicated  Lungs: Nml rate and effort  Heart:  regular rate   Abdomen: soft, non-tender; bowel sounds normal; no masses,  no organomegaly   Wound well approximated incision  GU exam:   Normal discharge        Assessment:  1. Vaginal discharge  - Cervicovaginal ancillary only( [name])  2. Postpartum care and examination   3. Dysuria - UA Nml  4. Birth control counseling - return for IUD in 2 weeks  Plan:   Essential components of care per ACOG recommendations:  1.  Mood and well being: Patient with negative depression screening today. Reviewed local resources for support.  - Patient tobacco use? No.   - hx of drug use? No.     Edinburgh Postnatal Depression Scale - 04/22/22 0930       Edinburgh Postnatal Depression Scale:  In the Past 7 Days   I have been able to laugh and see the funny side of things. 0    I have looked forward with enjoyment to things. 0    I have blamed myself unnecessarily when things went wrong. 0    I have been anxious or worried for no good reason. 0    I have felt scared or panicky for no good reason. 0    Things have been getting on top of me. 0    I have been so unhappy that I have had difficulty sleeping. 0    I have felt sad or miserable. 0    I  have been so unhappy that I have been crying. 0    The thought of harming myself has occurred to me. 0    Edinburgh Postnatal Depression Scale Total 0              2. Infant care and feeding:  -Patient currently breastmilk feeding? Yes. Discussed returning to work and pumping.  -Social determinants of health ([name]) reviewed in [name]. No concerns. The following needs were identified none  3. Sexuality, contraception and birth spacing - Patient does not want a pregnancy in the next year.  Desired family size is 2 children.  - Reviewed reproductive life planning. Reviewed contraceptive methods based on pt preferences and effectiveness.  Patient desired IUD or IUS today.   - Discussed birth spacing of 18 months  4. Sleep and fatigue -Encouraged family/partner/community support of 4 hrs of uninterrupted sleep to help with mood and fatigue  5. Physical Recovery  - Discussed patients delivery and complications. She describes her labor as good. - Patient had a C-section repeat; no problems after deliver. Patient had a  no  laceration. Perineal healing reviewed. Patient expressed understanding - Patient has urinary incontinence? No. - Patient is safe to resume physical and sexual activity with condoms until IUD placed  6.  Health Maintenance - HM due items addressed No - NA - Last pap smear  Diagnosis  Date Value Ref Range Status  09/05/2021   Final   - Negative for intraepithelial lesion or malignancy (NILM)   Pap smear not done at today's visit.  -Breast Cancer screening indicated? No.   7. Chronic Disease/Pregnancy Condition follow up: None  - PCP follow up--List given. Recommend yearly.   Manya Silvas, [name] for [name], [name]

## 2022-04-22 NOTE — Patient Instructions (Signed)
AREA FAMILY PRACTICE PHYSICIANS  [place] ([address]) [facility] [address] [place], [place] [address] [phone] Mon-Fri 8:30-12:30, 1:30-5:00 Accepting Medicaid [facility] [address], [place], [place] [address] [phone] Mon-Fri 8:00-5:30 [facility] [address] [place], [place] [address] [phone] Mon, Tue, Thur, Fri 8:30-5:00, Wed 10:00-7:00 (closed 1-2pm) Accepting Medicaid [facility] [address], [place], [place]  [address] Phone - [phone]   Fax - [phone]  [place] ([address]) [facility] [address] [place], [place] [address] [phone] Mon-Fri 8:00-5:00 [facility] ([facility])  [address] [place], [place] [address] [phone] Mon-Fri 8:30-5:30, Sat (Oct.-Mar.) 9:00-1:00 Accepting Medicaid  [place] ([address]) [facility] [address], [place], [place] [address] [phone] Mon-Fri 8:00-5:00  [place] ([address]) [facility] [address], [place], [place] [address] [phone] Mon-Fri 8:00-5:00 [facility] [address], [place], [place] [address] [phone] Mon-Fri 8:00-5:00 [facility] [address] [place], [place] [address] [phone] Mon-Fri 8:00-5:00 [facility] [address] [place] [address] [phone] Mon-Fri 7:30-5:30  [place] ([address] & [address]) [facility] [address] [place], [place] [address] [phone] Mon-Thur 8:00-6:00 Accepting Medicaid [facility] [address] [place], [place] [address] [phone] Mon-Thur 7:30-7:30, Fri 7:30-4:30 Accepting  Medicaid [facility] [address], [place], [place]  [address] [phone]   Fax - [phone]  [place] ([address] & [address]) [facility] [address] [place], [place] [address] [phone] Mon-Fri 7:00-5:00 [facility] [address], [place], [place] [address] [phone] Mon-Fri 8:00-5:00 Accepting Medicaid [facility] [address], [place], [place] [address] [phone] Mon-Fri 8:00-5:00 Accepting Medicaid  [place] ([address])  [facility] [place] [address] [place], [place] [address] [phone] Mon-Fri 8:00-5:00 [facility] [address], [place], [place] [address] [phone] Mon-Fri 8:00-5:00 Accepting Medicaid [facility] ([facility]) [address], [place], [place] [address] [phone] Mon-Fri 8:00-5:30, Sat&Sun by appointment (phones open at 8:30) Accepting Medicaid  [place] ([address]) [facility] [address] [place], [place] [address] [phone] Mon-Thur 8:00-7:00, Fri 8:00-5:00, Sat 8:00-12:00, Sun 9:00-12:00 Accepting Medicaid [facility] [place] [address], [place], [place] [address] [phone] Mon-Thur 8:00-5:00 Accepting Medicaid [facility] [address] [place], [place] [address] [phone] Mon-Fri 8:00-5:30, Sat (Oct.-Mar.) 9:00-1:00 Accepting Medicaid  [place] ([address]) [facility] [address], [place], [place] [address] [phone] Mon-Fri 8:00-5:00 Accepting Medicaid   [place] ([address]) [facility] [place] [address], [place], [place] [address] [phone] Mon-Fri 8:00-5:00  [facility] [place] [address], [place], [place] [address] [phone] Mon-Fri 8:00-5:00 [facility] [place] [address] [place] [address], [place],  [address] [phone] Mon-Fri 8:00-5:00 After hours clinic ([address] [place],  [address]) [phone] Mon-Fri 5:00-8:00, Sat 12:00-6:00, Sun 10:00-4:00 Accepting Medicaid [facility] [place]   [address], [place], [place]  [address] [phone]   Fax - [phone]  [place] ([address]) [facility] [place] [address], [place], [place] [address] [phone] Mon-Fri 8:00-5:00 [facility] [place] ([facility] [place]) [address], [place], [place] [address] [phone] Mon-Thur 8:00-7:00, Fri 8:00-5:00, Sat 8:00-12:00    

## 2022-04-23 LAB — CERVICOVAGINAL ANCILLARY ONLY
Bacterial Vaginitis (gardnerella): POSITIVE — AB
Candida Glabrata: NEGATIVE
Candida Vaginitis: NEGATIVE
Chlamydia: NEGATIVE
Comment: NEGATIVE
Comment: NEGATIVE
Comment: NEGATIVE
Comment: NEGATIVE
Comment: NORMAL
Neisseria Gonorrhea: NEGATIVE

## 2022-04-23 MED ORDER — METRONIDAZOLE 500 MG PO TABS: 500.0000 mg | ORAL_TABLET | Freq: Two times a day (BID) | ORAL | 0 refills | Status: DC

## 2022-04-23 NOTE — Addendum Note (Signed)
Addended by: Dorathy Kinsman on: 04/23/2022 03:30 PM   Modules accepted: Orders

## 2022-04-24 ENCOUNTER — Telehealth: Payer: Self-pay | Admitting: Lactation Services

## 2022-04-24 NOTE — Telephone Encounter (Signed)
Called patient with assistance of [place] telephone [place] interpreter, [place] [phone].   Spoke with patient to inform her that her vaginal swab showed she has BV. She reports she has read the provider note and has picked up her ATB.  She reports she has no questions or concerns at this time.

## 2022-04-24 NOTE — Telephone Encounter (Signed)
-----   Message from [place], [place] sent at 04/23/2022  3:30 PM EST ----- Please call pt with [place] interpreter to inform her of results, Rx.

## 2022-06-16 ENCOUNTER — Encounter: Payer: Self-pay | Admitting: Advanced Practice Midwife

## 2022-07-22 ENCOUNTER — Other Ambulatory Visit: Payer: Self-pay

## 2022-07-22 ENCOUNTER — Encounter: Payer: Self-pay | Admitting: Family Medicine

## 2022-07-22 ENCOUNTER — Ambulatory Visit (INDEPENDENT_AMBULATORY_CARE_PROVIDER_SITE_OTHER): Payer: Medicaid Other | Admitting: Family Medicine

## 2022-07-22 VITALS — BP 114/75 | HR 66 | Ht 70.87 in | Wt 150.3 lb

## 2022-07-22 DIAGNOSIS — Z30430 Encounter for insertion of intrauterine contraceptive device: Secondary | ICD-10-CM | POA: Diagnosis not present

## 2022-07-22 DIAGNOSIS — Z975 Presence of (intrauterine) contraceptive device: Secondary | ICD-10-CM | POA: Insufficient documentation

## 2022-07-22 LAB — POCT PREGNANCY, URINE: Preg Test, Ur: NEGATIVE

## 2022-07-22 MED ORDER — PARAGARD INTRAUTERINE COPPER IU IUD
1.0000 | INTRAUTERINE_SYSTEM | Freq: Once | INTRAUTERINE | Status: AC
  Administered 2022-07-22: 1 via INTRAUTERINE

## 2022-07-22 NOTE — Progress Notes (Signed)
    GYNECOLOGY OFFICE PROCEDURE NOTE  Heather Leonard is a 32 y.o. [id number] here for Paragard IUD insertion. No GYN concerns.  Last pap smear:  Lab Results  Component Value Date   DIAGPAP  09/05/2021    - Negative for intraepithelial lesion or malignancy (NILM)   [name] Negative 09/05/2021    Urine pregnancy test: negative  IUD Insertion Procedure Note Patient identified, informed consent performed, consent signed.   Discussed risks of irregular bleeding, heavier/longer periods, increased cramping, infection, malpositioning or misplacement of the IUD outside the uterus which may require further procedure such as laparoscopy. Also discussed >99% contraception efficacy, increased risk of ectopic pregnancy with failure of method.  Time out was performed.  US performed prior to insertion which shows slightly retroverted uterus. Speculum placed in the vagina.  Cervix visualized.  Cleaned with Betadine x 2. Uterus sounded to 7 cm. IUD placed per manufacturer's recommendations.  Strings trimmed to 3 cm. Good hemostasis noted.  Patient tolerated procedure well. Post procedure US showed IUD at fundus.   Patient was given post-procedure instructions.  She was advised to have backup contraception for one week.  Patient was also asked to check IUD strings periodically and follow up in 4 weeks for IUD check.  Clarnce Flock, MD/MPH Attending Family Medicine Physician, [facility] for [facility], [place]

## 2022-08-19 ENCOUNTER — Other Ambulatory Visit: Payer: Self-pay

## 2022-08-19 ENCOUNTER — Ambulatory Visit (INDEPENDENT_AMBULATORY_CARE_PROVIDER_SITE_OTHER): Payer: Medicaid Other | Admitting: Family Medicine

## 2022-08-19 ENCOUNTER — Encounter: Payer: Self-pay | Admitting: Family Medicine

## 2022-08-19 VITALS — BP 89/61 | HR 94 | Ht 70.87 in | Wt 149.6 lb

## 2022-08-19 DIAGNOSIS — Z975 Presence of (intrauterine) contraceptive device: Secondary | ICD-10-CM | POA: Diagnosis not present

## 2022-08-19 NOTE — Progress Notes (Signed)
   GYNECOLOGY OFFICE VISIT NOTE  History:   Heather Leonard is a 32 y.o. [id number] here today for IUD string check.  Currently on cycle, period heavier this month than usual Having less cramping pain though Overall happy with method  Health Maintenance Due  Topic Date Due   DTaP/Tdap/Td (1 - Tdap) Never done    Past Medical History:  Diagnosis Date   Anemia    Complication of anesthesia    problems walking and moving 1 week later   History of preterm delivery 08/15/2021   PPROM       Past Surgical History:  Procedure Laterality Date   CESAREAN SECTION     CESAREAN SECTION N/A 03/10/2022   Procedure: CESAREAN SECTION;  Surgeon: Gwynne Edinger, MD;  Location: [facility] LD ORS;  Service: Obstetrics;  Laterality: N/A;    The following portions of the patient's history were reviewed and updated as appropriate: allergies, current medications, past family history, past medical history, past social history, past surgical history and problem list.   Health Maintenance:   Last pap: Lab Results  Component Value Date   DIAGPAP  09/05/2021    - Negative for intraepithelial lesion or malignancy (NILM)   [place] Negative 09/05/2021    Last mammogram:  N/a    Review of Systems:  Pertinent items noted in HPI and remainder of comprehensive ROS otherwise negative.  Physical Exam:  BP (!) 89/61   Pulse 94   Ht 5' 10.87" (1.8 m)   Wt 149 lb 9.6 oz (67.9 kg)   LMP 08/16/2022 (Exact Date) Comment: heavy bleeding; lasting 4 days; changing pad/tampon up to 5 times today  Breastfeeding No   BMI 20.94 kg/m  CONSTITUTIONAL: Well-developed, well-nourished female in no acute distress.  HEENT:  Normocephalic, atraumatic. External right and left ear normal. No scleral icterus.  NECK: Normal range of motion, supple, no masses noted on observation SKIN: No rash noted. Not diaphoretic. No erythema. No pallor. MUSCULOSKELETAL: Normal range of motion. No edema noted. NEUROLOGIC: Alert and  oriented to person, place, and time. Normal muscle tone coordination.  PSYCHIATRIC: Normal mood and affect. Normal behavior. Normal judgment and thought content. RESPIRATORY: Effort normal, no problems with respiration noted ABDOMEN: No masses noted. No other overt distention noted.   PELVIC:  moderate amount of blood in vault, IUD strings visualized and are appropriate length  Labs and Imaging No results found for this or any previous visit (from the past 168 hour(s)). No results found.    Assessment and Plan:   Problem List Items Addressed This Visit       Other   IUD (intrauterine device) in place - Primary    IUD strings in place, no suspicion for movement. Follow up PRN       Routine preventative health maintenance measures emphasized. Please refer to After Visit Summary for other counseling recommendations.     Total face-to-face time with patient: 15 minutes.  Over 50% of encounter was spent on counseling and coordination of care.   Clarnce Flock, MD/MPH Attending Family Medicine Physician, [facility] for [facility], [place]

## 2022-08-19 NOTE — Assessment & Plan Note (Signed)
IUD strings in place, no suspicion for movement. Follow up PRN

## 2022-11-27 DIAGNOSIS — R112 Nausea with vomiting, unspecified: Secondary | ICD-10-CM | POA: Diagnosis not present

## 2023-01-14 ENCOUNTER — Encounter: Payer: Self-pay | Admitting: Family Medicine

## 2023-04-24 ENCOUNTER — Encounter: Payer: Self-pay | Admitting: Family Medicine

## 2023-06-15 ENCOUNTER — Ambulatory Visit: Payer: Medicaid Other | Admitting: Family Medicine

## 2023-06-15 ENCOUNTER — Other Ambulatory Visit: Payer: Self-pay

## 2023-06-15 VITALS — BP 125/64 | HR 67 | Wt 142.8 lb

## 2023-06-15 DIAGNOSIS — Z975 Presence of (intrauterine) contraceptive device: Secondary | ICD-10-CM | POA: Diagnosis not present

## 2023-06-15 DIAGNOSIS — Z603 Acculturation difficulty: Secondary | ICD-10-CM

## 2023-06-15 DIAGNOSIS — Z758 Other problems related to medical facilities and other health care: Secondary | ICD-10-CM

## 2023-06-15 NOTE — Progress Notes (Signed)
   Subjective:    Patient ID: Heather Leonard is a 32 y.o. female presenting with No chief complaint on file.  on 06/15/2023  HPI: Wants IUD check.Having regular cycles. Cycles were heavier in first 4 months.In [place], had annual pelvic u/s. Denies significant pain or bleeding.  Review of Systems  Constitutional:  Negative for chills and fever.  Respiratory:  Negative for shortness of breath.   Cardiovascular:  Negative for chest pain.  Gastrointestinal:  Negative for abdominal pain, nausea and vomiting.  Genitourinary:  Negative for dysuria.  Skin:  Negative for rash.      Objective:    BP 125/64   Pulse 67   Wt 142 lb 12.8 oz (64.8 kg)   BMI 19.99 kg/m  Physical Exam Exam conducted with a chaperone present.  Constitutional:      General: She is not in acute distress.    Appearance: She is well-developed.  HENT:     Head: Normocephalic and atraumatic.  Eyes:     General: No scleral icterus. Cardiovascular:     Rate and Rhythm: Normal rate.  Pulmonary:     Effort: Pulmonary effort is normal.  Abdominal:     Palpations: Abdomen is soft.  Genitourinary:    Comments: BUS normal, vagina is pink and rugated, cervix is nulliparous without lesion, IUD strings are noted Musculoskeletal:     Cervical back: Neck supple.  Skin:    General: Skin is warm and dry.  Neurological:     Mental Status: She is alert and oriented to person, place, and time.         Assessment & Plan:   Problem List Items Addressed This Visit       Unprioritized   Language barrier   [place] interpreter: video used       IUD (intrauterine device) in place - Primary   In place--no issues       Return in about 1 year (around 06/14/2024) for a CPE.  Reva Bores, MD 06/15/2023 11:14 AM

## 2023-06-15 NOTE — Assessment & Plan Note (Signed)
[place] interpreter: video used

## 2023-06-15 NOTE — Assessment & Plan Note (Signed)
In place--no issues

## 2023-11-22 IMAGING — US US MFM OB TRANSVAGINAL
1 series · 12 of 28 positions shown · non-contrast
Comparison: none

[Series 1: us mfm ob transvaginal · 84 acquisitions, 12 frames shown]
[im 4/84]
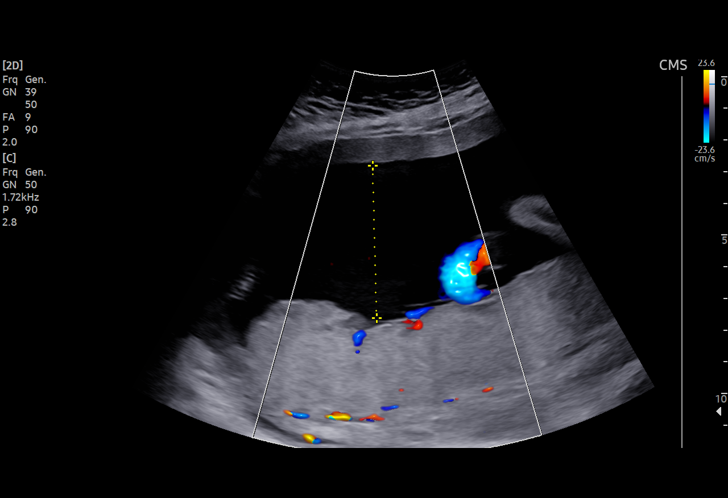
[im 10/84]
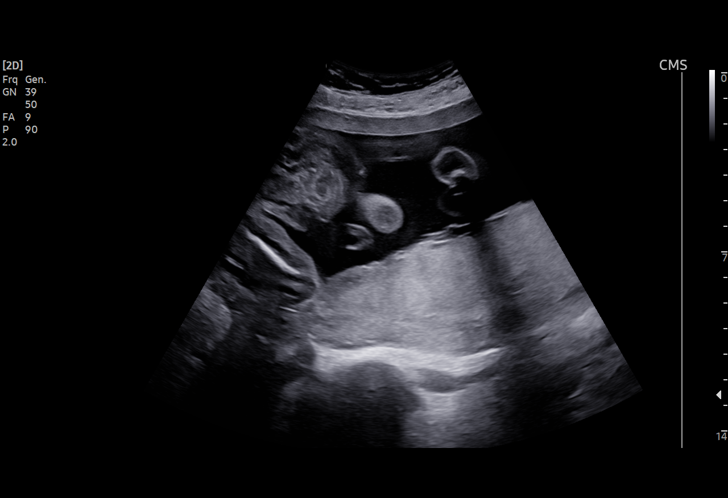
[im 16/84]
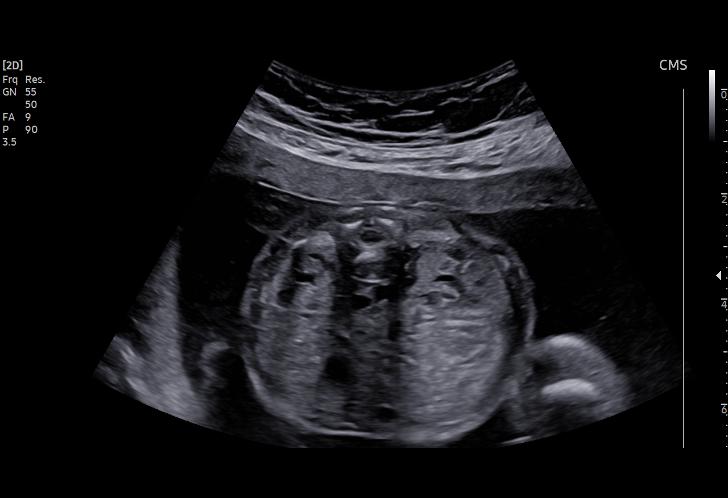
[im 25/84]
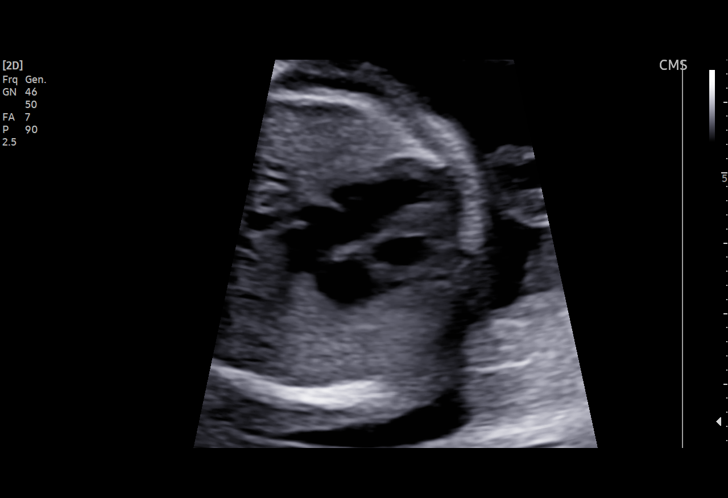
[im 31/84]
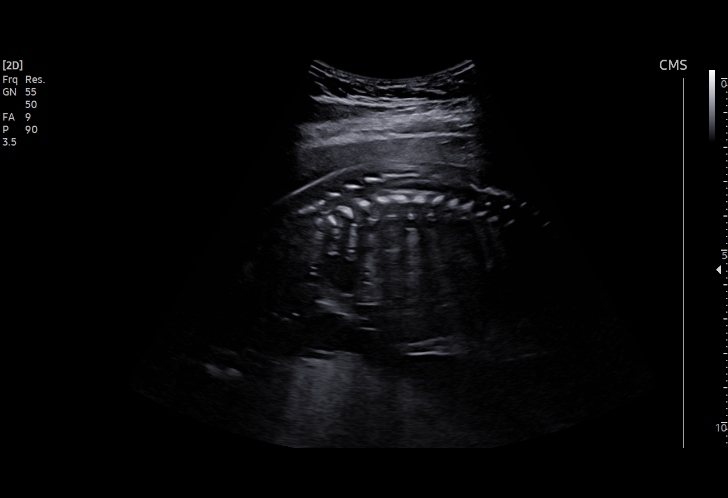
[im 37/84]
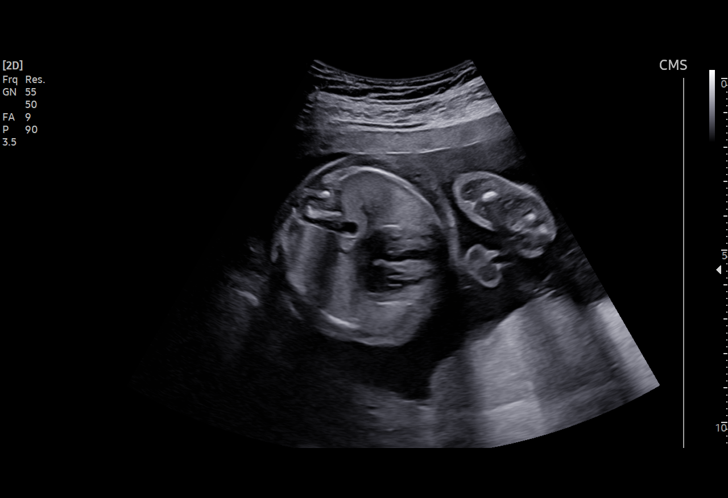
[im 47/84]
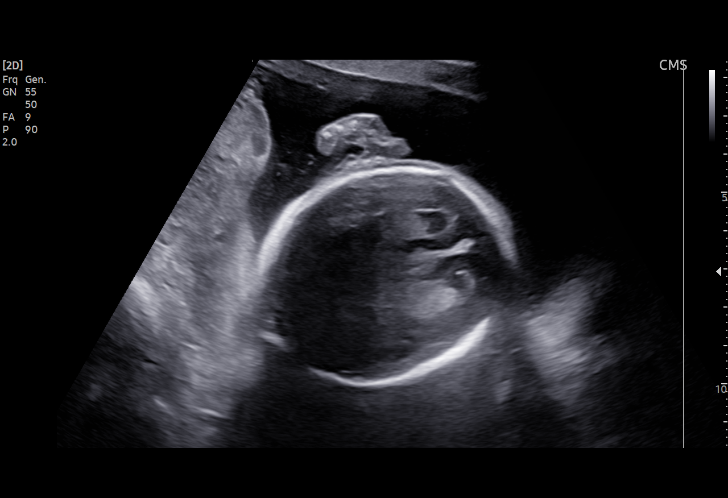
[im 53/84]
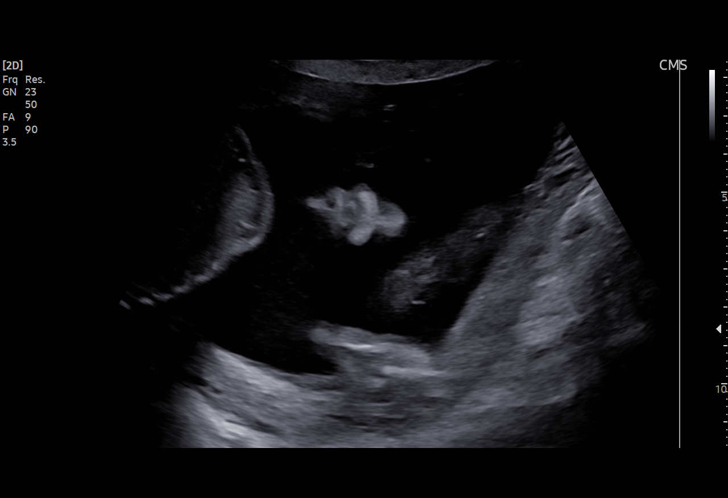
[im 59/84]
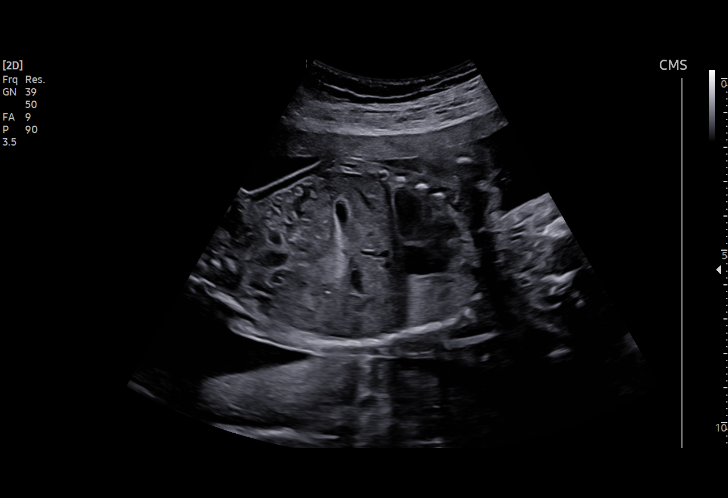
[im 68/84]
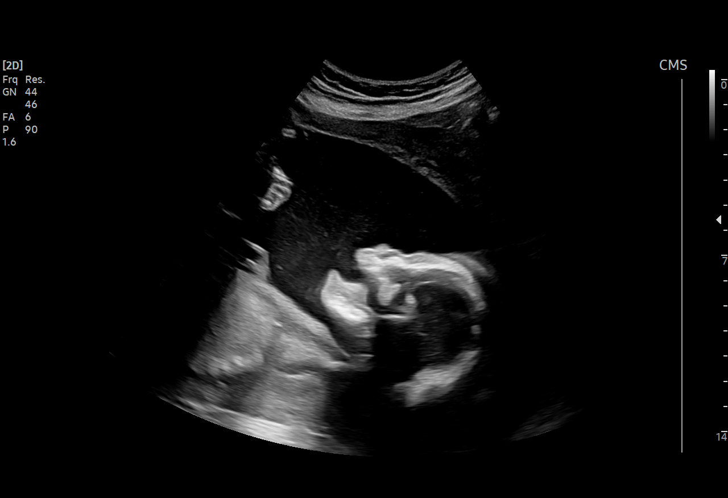
[im 74/84]
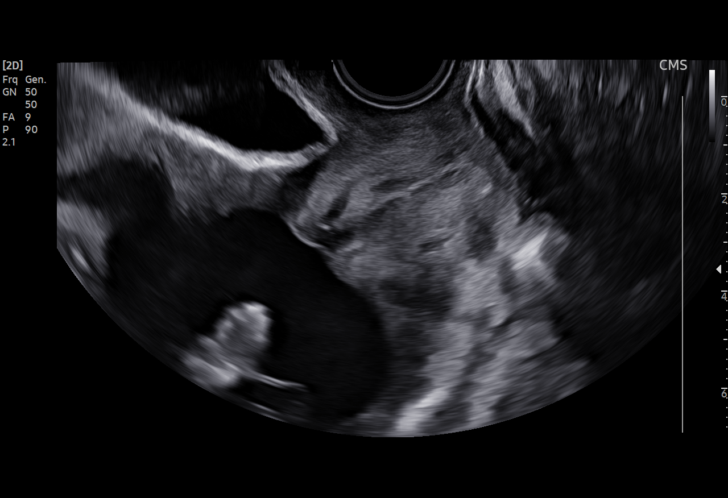
[im 80/84]
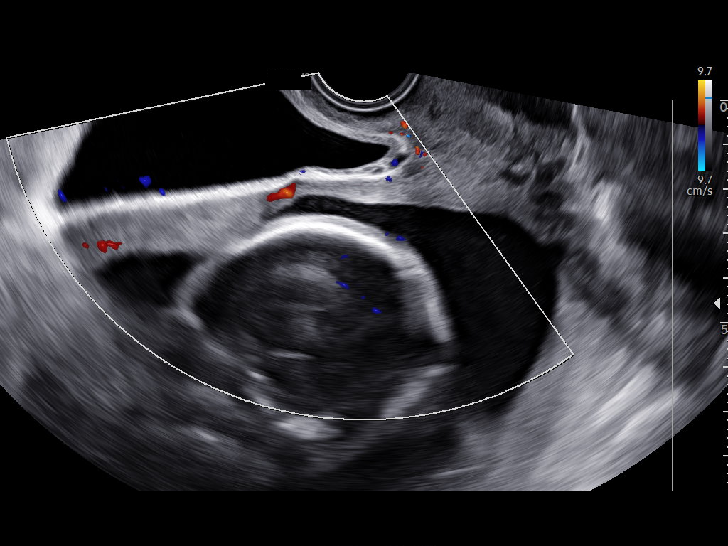

[12 of 28 positions shown; findings below may reference images not displayed]

Indications

 22 weeks gestation of pregnancy
 Poor obstetric history: Previous preterm
 delivery, antepartum (99wks)
 Previous cesarean delivery, antepartum
 LR-NIPS/Horizon-Neg
 Poor obstetrical history (PPROM)
Fetal Evaluation

 Num Of Fetuses:         1
 Fetal Heart Rate(bpm):  153
 Cardiac Activity:       Observed
 Presentation:           Cephalic
 Placenta:               Posterior
 P. Cord Insertion:      Visualized

 Amniotic Fluid
 AFI FV:      Within normal limits

                             Largest Pocket(cm)

Biometry

 BPD:        56  mm     G. Age:  23w 1d         72  %    CI:        77.41   %    70 - 86
                                                         FL/HC:      18.5   %    18.4 -
 HC:      201.5  mm     G. Age:  22w 2d         31  %    HC/AC:      1.12        1.06 -
 AC:      180.1  mm     G. Age:  22w 6d         57  %    FL/BPD:     66.4   %    71 - 87
 FL:       37.2  mm     G. Age:  21w 6d         22  %    FL/AC:      20.7   %    20 - 24
 LV:          5  mm
 Est. FW:     504  gm      1 lb 2 oz     44  %
OB History

 Gravidity:    2         Term:   0        Prem:   1        SAB:   0
 TOP:          0       Ectopic:  0        Living: 1
Gestational Age

 LMP:           22w 3d        Date:  06/10/21                 EDD:   03/17/22
 U/S Today:     22w 4d                                        EDD:   03/16/22
 Best:          22w 3d     Det. By:  LMP  (06/10/21)          EDD:   03/17/22
Anatomy

 Cranium:               Appears normal         Aortic Arch:            Appears normal
 Cavum:                 Appears normal         Ductal Arch:            Previously seen
 Ventricles:            Appears normal         Diaphragm:              Appears normal
 Choroid Plexus:        Appears normal         Stomach:                Appears normal, left
                                                                       sided
 Cerebellum:            Appears normal         Abdomen:                Appears normal
 Posterior Fossa:       Appears normal         Abdominal Wall:         Appears nml (cord
                                                                       insert, abd wall)
 Nuchal Fold:           Previously seen        Cord Vessels:           Appears normal (3
                                                                       vessel cord)
 Face:                  Appears normal         Kidneys:                Appear normal
                        (orbits and profile)
 Lips:                  Appears normal         Bladder:                Appears normal
 Thoracic:              Appears normal         Spine:                  Appears normal
 Heart:                 Appears normal         Upper Extremities:      Previously seen
                        (4CH, axis, and
                        situs)
 RVOT:                  Previously seen        Lower Extremities:      Previously seen
 LVOT:                  Appears normal

 Other:  Female gender previously seen. Heels/feet and open hands/5th
         digits visualized. Nasal bone, lenses, maxilla, mandible and falx
         previously visualized. VC, 3VV and 3VTV visualized.
Cervix Uterus Adnexa

 Cervix
 Length:            3.7  cm.
 Normal appearance by transvaginal scan

 Uterus
 No abnormality visualized.

 Right Ovary
 Within normal limits.

 Left Ovary
 Within normal limits.

 Cul De Sac
 No free fluid seen.
 Adnexa
 No abnormality visualized.
Impression

 Patient return for cervical length measurement and fetal
 growth assessment.
 Obstetrical history is significant for a preterm cesarean
 delivery at 33 weeks gestation in [place], [place].
 Patient was counseled at her last visit that intramuscular
 progesterone (17-OHP) has been withdrawn and that vaginal
 progesterone may be considered if cervical length
 measurement is below 2.5 cm.
 It is unclear whether she had a low transverse cesarean
 delivery or a classical cesarean section.  Patient and her
 husband informed that it will be very difficult to obtain
 operative records.
 On today's ultrasound, fetal growth is appropriate for
 gestational age.  Amniotic fluid is normal and good fetal
 activity seen.  Myometrial bladder interface appears normal.
 No thinning of uterine wall is seen.
 On transvaginal ultrasound, the cervix measures 3.7 cm,
 which is normal.  No shortening or funneling was seen on
 transfundal pressure.
 I counseled the patient with help of language interpreter
 present in the room.
 Ultrasound will be unable to detect the type of uterine scar.  I
 recommend an evaluation at 34 weeks gestation to identify
 myometrial thinning.
 Most obstetricians perform low transverse cesarean delivery
 at 33 weeks gestation.  It is reasonable to consider delivery at
 39 weeks gestation.
Recommendations

 -An appointment was made for her to return in 12 weeks for
 fetal growth assessment.
 -Check for myometrial thinning or ballooning (previous
 cesarean section).
                [employer], [place]

## 2024-04-20 DIAGNOSIS — Z1321 Encounter for screening for nutritional disorder: Secondary | ICD-10-CM | POA: Diagnosis not present

## 2024-04-20 DIAGNOSIS — Z0000 Encounter for general adult medical examination without abnormal findings: Secondary | ICD-10-CM | POA: Diagnosis not present

## 2024-04-20 DIAGNOSIS — N644 Mastodynia: Secondary | ICD-10-CM | POA: Diagnosis not present

## 2024-04-20 DIAGNOSIS — Z13220 Encounter for screening for lipoid disorders: Secondary | ICD-10-CM | POA: Diagnosis not present

## 2024-05-19 DIAGNOSIS — R2233 Localized swelling, mass and lump, upper limb, bilateral: Secondary | ICD-10-CM | POA: Diagnosis not present

## 2024-05-19 DIAGNOSIS — N644 Mastodynia: Secondary | ICD-10-CM | POA: Diagnosis not present
